# Patient Record
Sex: Female | Born: 1935 | Race: White | Hispanic: No | Smoking: Former smoker
Health system: Southern US, Community
[De-identification: ages and names within clinical notes are randomized; demographics above are authoritative.]

## PROBLEM LIST (undated history)

## (undated) DIAGNOSIS — E785 Hyperlipidemia, unspecified: Secondary | ICD-10-CM

## (undated) DIAGNOSIS — I10 Essential (primary) hypertension: Secondary | ICD-10-CM

## (undated) DIAGNOSIS — IMO0002 Reserved for concepts with insufficient information to code with codable children: Secondary | ICD-10-CM

## (undated) DIAGNOSIS — Z85038 Personal history of other malignant neoplasm of large intestine: Secondary | ICD-10-CM

## (undated) DIAGNOSIS — M1990 Unspecified osteoarthritis, unspecified site: Secondary | ICD-10-CM

## (undated) DIAGNOSIS — C801 Malignant (primary) neoplasm, unspecified: Secondary | ICD-10-CM

## (undated) DIAGNOSIS — E079 Disorder of thyroid, unspecified: Secondary | ICD-10-CM

## (undated) DIAGNOSIS — M8580 Other specified disorders of bone density and structure, unspecified site: Secondary | ICD-10-CM

## (undated) HISTORY — PX: LYMPH NODE BIOPSY: SHX201

## (undated) HISTORY — DX: Essential (primary) hypertension: I10

## (undated) HISTORY — DX: Unspecified osteoarthritis, unspecified site: M19.90

## (undated) HISTORY — DX: Hyperlipidemia, unspecified: E78.5

## (undated) HISTORY — DX: Disorder of thyroid, unspecified: E07.9

## (undated) HISTORY — DX: Reserved for concepts with insufficient information to code with codable children: IMO0002

## (undated) HISTORY — DX: Malignant (primary) neoplasm, unspecified: C80.1

## (undated) HISTORY — DX: Other specified disorders of bone density and structure, unspecified site: M85.80

## (undated) HISTORY — DX: Personal history of other malignant neoplasm of large intestine: Z85.038

---

## 2006-03-09 ENCOUNTER — Ambulatory Visit (HOSPITAL_COMMUNITY): Admission: RE | Admit: 2006-03-09 | Discharge: 2006-03-09 | Payer: Self-pay | Admitting: Family Medicine

## 2006-04-15 HISTORY — PX: OTHER SURGICAL HISTORY: SHX169

## 2006-04-21 ENCOUNTER — Ambulatory Visit (HOSPITAL_COMMUNITY): Admission: RE | Admit: 2006-04-21 | Discharge: 2006-04-21 | Payer: Self-pay | Admitting: Otolaryngology

## 2006-04-26 ENCOUNTER — Other Ambulatory Visit: Admission: RE | Admit: 2006-04-26 | Discharge: 2006-04-26 | Payer: Self-pay | Admitting: Otolaryngology

## 2006-05-11 ENCOUNTER — Other Ambulatory Visit: Admission: RE | Admit: 2006-05-11 | Discharge: 2006-05-11 | Payer: Self-pay | Admitting: Family Medicine

## 2006-05-26 ENCOUNTER — Inpatient Hospital Stay (HOSPITAL_COMMUNITY): Admission: RE | Admit: 2006-05-26 | Discharge: 2006-05-27 | Payer: Self-pay | Admitting: Otolaryngology

## 2006-06-14 ENCOUNTER — Ambulatory Visit: Payer: Self-pay | Admitting: Internal Medicine

## 2006-06-17 ENCOUNTER — Ambulatory Visit (HOSPITAL_COMMUNITY): Admission: RE | Admit: 2006-06-17 | Discharge: 2006-06-17 | Payer: Self-pay | Admitting: Internal Medicine

## 2006-06-23 ENCOUNTER — Encounter: Admission: AD | Admit: 2006-06-23 | Discharge: 2006-06-23 | Payer: Self-pay | Admitting: Dentistry

## 2006-06-23 ENCOUNTER — Ambulatory Visit: Payer: Self-pay | Admitting: Dentistry

## 2006-07-12 ENCOUNTER — Ambulatory Visit (HOSPITAL_COMMUNITY): Admission: RE | Admit: 2006-07-12 | Discharge: 2006-07-13 | Payer: Self-pay | Admitting: Otolaryngology

## 2006-07-12 ENCOUNTER — Encounter (INDEPENDENT_AMBULATORY_CARE_PROVIDER_SITE_OTHER): Payer: Self-pay | Admitting: Specialist

## 2006-07-27 ENCOUNTER — Ambulatory Visit: Admission: RE | Admit: 2006-07-27 | Discharge: 2006-10-08 | Payer: Self-pay | Admitting: Radiation Oncology

## 2007-06-30 ENCOUNTER — Ambulatory Visit: Payer: Self-pay | Admitting: *Deleted

## 2008-07-12 ENCOUNTER — Ambulatory Visit: Payer: Self-pay | Admitting: *Deleted

## 2009-01-10 ENCOUNTER — Ambulatory Visit: Payer: Self-pay | Admitting: *Deleted

## 2009-07-09 ENCOUNTER — Telehealth: Payer: Self-pay | Admitting: Internal Medicine

## 2009-12-31 ENCOUNTER — Ambulatory Visit: Payer: Self-pay | Admitting: Vascular Surgery

## 2010-07-15 NOTE — Progress Notes (Signed)
Summary: Schedule recall colon  Phone Note Outgoing Call Call back at [facility] Phone [phone]   Call placed by: Christie Nottingham CMA Duncan Dull),  July 09, 2009 1:58 PM Call placed to: Patient Summary of Call: Called pt to schedule recall colon due to her personal history of colon cancer. Pt states she just had a procedure in 2009 at [facility] and switched care. I told her that I would put a note in the sytem.  Initial call taken by: Christie Nottingham CMA (AAMA),  July 09, 2009 2:00 PM

## 2010-10-28 NOTE — Procedures (Signed)
CAROTID DUPLEX EXAM   INDICATION:  Carotid disease.   HISTORY:  Diabetes:  Yes.  Cardiac:  Arrhythmia.  Hypertension:  Yes.  Smoking:  Previous.  Previous Surgery:  No.  CV History:  Currently asymptomatic.  Amaurosis Fugax No, Paresthesias No, Hemiparesis No.                                       RIGHT                    LEFT  Brachial systolic pressure:         140                      164  Brachial Doppler waveforms:         Abnormal                 Normal  Vertebral direction of flow:        Atypical                 Antegrade  DUPLEX VELOCITIES (cm/sec)  CCA peak systolic                   P = 310/M = 94           102  ECA peak systolic                   128                      117  ICA peak systolic                   56                       134  ICA end diastolic                   14                       18  PLAQUE MORPHOLOGY:                  Calcific                 Mixed  PLAQUE AMOUNT:                      Moderate                 Moderate  PLAQUE LOCATION:                    ICA/ECA/CCA/innominate   ICA/ECA/CCA   IMPRESSION:  1. Doppler velocities suggest a 1% to 39% stenosis of the right      proximal internal carotid artery and a 40% to 59% stenosis of the      left proximal internal carotid artery; however, the percentage of      stenosis in the right internal carotid artery may be underestimated      due to calcific  plaque shadowing.  2. There is a significant plaque formation noted in the innominate      artery, resulting in an increased velocity of the right proximal      common carotid artery.  3. Asymmetric bilateral brachial pressures  noted with possible      bidirectional flow noted in the right vertebral artery, based on      limited visualization.  4. No significant change in the bilateral internal carotid arteries      noted when compared to the previous examination on 01/10/2009.       ___________________________________________  Quita Skye. Hart Rochester, M.D.   CH/MEDQ  D:  01/01/2010  T:  01/01/2010  Job:  [id number]

## 2010-10-28 NOTE — Procedures (Signed)
CAROTID DUPLEX EXAM   INDICATION:  Follow-up evaluation of known carotid artery disease.   HISTORY:  Diabetes:  Yes.  Cardiac:  No.  Hypertension:  Yes.  Smoking:  Quit in 1995.  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  CV History:  Previous carotid on March 09, 2006 revealed bilateral  calcified plaque in the carotid arteries but no significant ICA  stenosis.  At that time, a left jaw mass was discovered.  Amaurosis Fugax No, Paresthesias No, Hemiparesis No                                       RIGHT             LEFT  Brachial systolic pressure:         176               174  Brachial Doppler waveforms:         Triphasic         Triphasic  Vertebral direction of flow:        Antegrade         Antegrade  DUPLEX VELOCITIES (cm/sec)  CCA peak systolic                   60                67  ECA peak systolic                   138               94  ICA peak systolic                   92                108  ICA end diastolic                   16                18  PLAQUE MORPHOLOGY:                  Calcified, irregular                Calcified, irregular  PLAQUE AMOUNT:                      Moderate          Moderate  PLAQUE LOCATION:                    Proximal ICA, ECA Proximal ICA, ECA   IMPRESSION:  20-39% internal carotid artery stenosis bilaterally;  however, acoustic shadowing may mask higher velocities bilaterally.  No  significant change from previous study.   ___________________________________________  P. Liliane Bade, M.D.   MC/MEDQ  D:  06/30/2007  T:  06/30/2007  Job:  [id number]

## 2010-10-28 NOTE — Procedures (Signed)
CAROTID DUPLEX EXAM   INDICATION:  Followup evaluation of known carotid artery disease.   HISTORY:  Diabetes:  Yes.  Cardiac:  Arrhythmias.  Hypertension:  Yes.  Smoking:  Quit in 1995.  Previous Surgery:  The patient had a left jaw cancer removed in 04/2006.  CV History:  The patient reports no cerebrovascular symptoms at this  time.  Previous duplex on 06/30/2007 revealed 20-39% ICA stenosis  bilaterally.  Amaurosis Fugax No, Paresthesias No, Hemiparesis No                                       RIGHT             LEFT  Brachial systolic pressure:         144               150  Brachial Doppler waveforms:         Triphasic         Triphasic  Vertebral direction of flow:        Antegrade         Antegrade  DUPLEX VELOCITIES (cm/sec)  CCA peak systolic                   69                105  ECA peak systolic                   163               145  ICA peak systolic                   103               153  ICA end diastolic                   18                21  PLAQUE MORPHOLOGY:                  Mixed             Calcified  irregular  PLAQUE AMOUNT:                      Moderate          Mild to moderate  PLAQUE LOCATION:                    Proximal CCA, innominate, ICA       Proximal ICA/distal CCA   IMPRESSION:  1. 20-39% right ICA stenosis.  2. 40-59% left ICA stenosis.  3. Innominate peak systolic velocity 383 cm/second.  Motile structure      seen within the distal innominate artery.  4. Dr. Madilyn Fireman was notified of preliminary results.  The patient was set      up for a followup exam in 6 months.   ___________________________________________  P. Liliane Bade, M.D.   MC/MEDQ  D:  07/12/2008  T:  07/12/2008  Job:  [id number]

## 2010-10-28 NOTE — Assessment & Plan Note (Signed)
OFFICE VISIT   Kathy Robbins, Kathy Robbins  DOB:  03/31/1931                                       12/31/2009  CHART#:[id number]   CHIEF COMPLAINT:  Follow-up carotid studies.   HISTORY OF PRESENT ILLNESS:  Patient is a 77 year old woman who we have  been following for mild to moderate carotid stenosis, right greater than  left, secondary to a bruit heard by her primary care physician.  The  patient was last seen in November 2007 but has had vascular labs since  that time every year.  She has had no symptoms of amaurosis, speech  difficulties, swallowing difficulties, facial droop, or any other signs  of stroke.   PAST MEDICAL HISTORY:  She has some macular degeneration, some diabetes,  and some hypertension.  She denies any problems with steal symptoms in  her right upper extremity.  She has good sensation and motion in the  hand and is able to use it without difficulty.   Doppler studies of her carotids shows that her brachial systolic  pressure is 140 on the right and 164 on the left with abnormal brachial  Doppler wave forms on the right.  The CCA peak systolic, the proximal  was 310, which is elevated from her last study.  Her end-diastolic  internal carotid artery velocities were 14 on the right and 18 on the  left.  The velocities suggest 1% to 39% stenosis of the right proximal  internal carotid artery and 40% to 59% stenosis of the left proximal  internal carotid artery, but it was felt that the percentage of stenosis  in the right ICA may be underestimated due to calcified plaque  shadowing.  It was also noted that there was significant plaque  formation in the innominate artery, resulting in an increased velocities  of the right proximal common carotid artery.   On physical exam, this is a well-developed, well-nourished female  looking older than her 78 years.  Her blood pressure is 164/86 on the  left, and it was 140/80 on the right.  Sats are 96%, and  respiratory  rate is 12.  She is alert and oriented x3.  She has good equal strength  in bilateral upper and lower extremities.  She has 2+ radial pulses  bilaterally.  Both hands are warm and pink.   ASSESSMENT/PLAN:  1. Stable internal carotid artery low level stenoses.  2. Significant plaque formation in the innominate artery on the right      with increased velocities in the right proximal common carotid      artery.   Plan is to follow up in 1 year.  Information regarding signs and  symptoms of stroke and other issues of steal syndrome were discussed  with the patient.   Kathy Goo, PA-C   Quita Skye. Hart Rochester, M.D.  Electronically Signed   RR/MEDQ  D:  12/31/2009  T:  12/31/2009  Job:  [id number]

## 2010-10-28 NOTE — Procedures (Signed)
CAROTID DUPLEX EXAM   INDICATION:  Followup evaluation of known carotid artery disease.   HISTORY:  Diabetes:  Yes.  Cardiac:  Arrhythmia.  Hypertension:  Yes.  Smoking:  Quit in 1995.  Previous Surgery:  No.  CV History:  No.  Amaurosis Fugax No, Paresthesias No, Hemiparesis No                                       RIGHT             LEFT  Brachial systolic pressure:         156               170  Brachial Doppler waveforms:         WNL               WNL  Vertebral direction of flow:        Antegrade         Antegrade  DUPLEX VELOCITIES (cm/sec)  CCA peak systolic                   155               97  ECA peak systolic                   196               161  ICA peak systolic                   120               158  ICA end diastolic                   24                31  PLAQUE MORPHOLOGY:                  Calcified         Calcified  PLAQUE AMOUNT:                      Moderate          Moderate  PLAQUE LOCATION:                    CCA/ICA/ECA       CCA/ICA/ECA   IMPRESSION:  1. 20-39% right internal carotid artery stenosis.  2. 40-59% left internal carotid artery stenosis.  3. No significant change from previous study.   ___________________________________________  P. Liliane Bade, M.D.   AC/MEDQ  D:  01/10/2009  T:  01/10/2009  Job:  [id number]

## 2010-10-31 NOTE — Op Note (Signed)
Kathy Robbins, Kathy Robbins               ACCOUNT NO.:  [id number]   MEDICAL RECORD NO.:  [id number]          PATIENT TYPE:  AMB   LOCATION:  SDS                          FACILITY:  [facility]   PHYSICIAN:  Gabrielle Dare. Janee Morn, M.D.DATE OF BIRTH:  November 30, 1930   DATE OF PROCEDURE:  07/12/2006  DATE OF DISCHARGE:                               OPERATIVE REPORT   PREOPERATIVE DIAGNOSES:  1. Head and neck cancer  2. Need for enteral access and Port-A-Cath for chemotherapy.   POSTOPERATIVE DIAGNOSES:  1. Head and neck cancer  2. Need for enteral access and Port-A-Cath for chemotherapy.   OPERATION/PROCEDURE:  1. Percutaneous endoscopic gastrostomy tube placement.  2. Port-A-Cath placement via left subclavian vein.   SURGEON:  Violeta Gelinas, M.D.   ASSISTANT:  Earney Hamburg, PA-C.  He assisted on the PEG tube only.   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  She has  suspicious oral lesions.  Dr. Pollyann Kennedy is proceeding with oral biopsies  with possible partial glossectomy today.  The patient needs enteral  access and a Port-A-Cath placement.  We are proceeding with Dr. Lucky Rathke  portion to follow.   PROCEDURE IN DETAIL:  Informed consent was obtained.  The patient  received intravenous antibiotics.  She was identified in the preop  holding area.  She is brought to the operating room.  General anesthesia  was administered.  Her abdomen was prepped and draped in the sterile  fashion. Esophagogastric endoscope was inserted via her mouth into her  esophagus and down into her stomach.  No gross esophageal lesions were  noted.  Stomach was distended and then the scope was passed through the  pylorus.  First and second portions of the duodenum were unremarkable.  Scope was withdrawn back into the stomach.  Stomach was insufflated  further and excellent poke was obtained under direct visualization.  The  Angiocath was inserted into the stomach followed by the guidewire.  This  was snared with the endoscopic snare and brought out through the mouth.  The PEG tube was attached to the snare and then the guidewire was  withdrawn from the abdominal wall.  The PEG tube was brought out through  the anterior abdominal wall.  The scope was reintroduced back into the  stomach and the PEG tube position was visualized.  The flange was placed  and it was tightened up so it just turned.  The antibiotic ointment was  placed around the PEG tube site.  Stomach was decompressed and the PEG  tube connector was placed.  Scope was removed.  Pictures were taken of  the location of the PEG.  The patient tolerated this portion of the  procedure well.   Now the patient's chest and neck were prepped and draped in sterile  fashion.  Some local anesthetic was injected in the left subclavian  region.  The left subclavian vein was entered with one stick without  difficulty.  The guidewire was placed in its position into the superior  vena cava.  Was checked under fluoroscopy.  At this time some additional  local anesthetic was injected around the planned portion for the pocket  on the anterior chest wall.  Transverse incision was made in the left  upper chest.  Subcutaneous tissues were dissected out making a pocket to  fit the port.  Once this was accomplished, a small nick was made next to  the guidewire which remained in place and the tunneler was used to draw  up the Port-A-Cath catheter from the pocket next to the guidewire.  The  port was measured to length and trimmed.  It was attached to the port  and the port was inserted into the pocket.  The introducer sheath was  placed over the guidewire with the dilator.  Those were removed leaving  the introducer sheath.  The port catheter was then slid down the sheath.  The sheath was split with the catheter advanced without difficulty.  Catheter positioned.  Her  primary sheath was removed.  The  catheter was  positioned checking the tip under fluoro to be in the superior vena  cava.  The port was secured to the underlying tissues with 2-0 Prolene.  Two sutures were used.  The port was then flushed with heparinized  saline and  it flushed easily and 1 mL of concentrated straight heparin  saline was then placed.  Subcutaneous tissues in the pocket were  irrigated.  Subcutaneous tissues were approximated with running 3-0  Vicryl suture and the skin of the pocket site and the subclavian wounds  were closed with 4-0 Vicryl subcuticular stitch.  Sponge, needle and  instrument counts were all correct the patient tolerated procedure well  without apparent complication.  Remained in the operating room with Dr.  Pollyann Kennedy and the case proceed with his portion of the case.  We will check  a STAT portable chest x-ray in the recovery room afterwards.      Gabrielle Dare Janee Morn, M.D.  Electronically Signed     BET/MEDQ  D:  07/12/2006  T:  07/13/2006  Job:  [id number]   cc:   [facility] H. Pollyann Kennedy, MD

## 2010-10-31 NOTE — Op Note (Signed)
Kathy Robbins, Kathy Robbins               ACCOUNT NO.:  [id number]   MEDICAL RECORD NO.:  [id number]          PATIENT TYPE:  OIB   LOCATION:  5735                         FACILITY:  [facility]   PHYSICIAN:  Jefry H. 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   POSTOP DIAGNOSIS:  Carcinoma in situ possibly minimally invasive  carcinoma left lateral tongue.   PROCEDURE:  Is biopsy of tongue and partial glossectomy.   SURGEON:  Dr. Pollyann Kennedy   ASSISTANT:  None.   BLOOD LOSS:  40-50 mL.   FINDINGS:  A small very superficial irregular lesion involving the left  posterolateral tongue. Frozen section diagnosis consistent with  carcinoma in situ possible minimal invasion.   HISTORY:  This is a 77 year old lady who presented with a 4 cm neck mass  that was biopsied and found to contain squamous cell carcinoma.  A neck  dissection was performed and no other nodes were positive except for the  initially identified node.  Upper endoscopy and directed biopsies  revealed no suspicious lesions or positive biopsies.  On further  investigation while getting ready for postop radiation a suspicious  lesion was identified on her posterolateral tongue.  There was another  lesion identified in the floor of mouth also on the left side.  PET scan  did not reveal any activity in either of these areas.  Risks, benefits,  alternatives, complications of procedure explained to the patient and  her family who seemed to understand and agreed to surgery.   PROCEDURE:  The patient was taken to the operating room, placed the  operating table in supine position.  Following induction of general  endotracheal anesthesia the patient was initially cared for by Dr. Violeta Gelinas who  performed a percutaneous endoscopic gastrostomy tube  placement and placement of a Port-A-Cath.  Following his procedures of  which there were no complications, the patient was then handed over to  myself.  Extensive thorough survey of the oral mucosa revealed the  patient's lesion in the posterolateral tongue.  There no other lesions  identified.  The leukoplakic lesion of the floor of mouth had completely  disappeared.  A 15 scalpel and forceps was used to excise the lesion in  the posterolateral tongue.  This was sent for frozen section evaluation.  Frozen section evaluation revealed the above-mentioned findings.  A  partial glossectomy was then performed.  A 1-1.5 cm tissue margin was  taken around the entire lesion using electrocautery.  The midline was  not crossed.  The anterior tongue was left intact.  Several small  bleeders were tied using silk ligatures.  Electrocautery was used for  the dissection and for hemostasis.  The specimen was labeled long stitch  anterior, short stitch lateral, margin left side and sent for  routine pathologic evaluation.  No other palpable or visible  masses  identified.  The wound was closed using interrupted inverted 3-0 Vicryl  sutures.  The oral cavity and pharynx was irrigated and suctioned of all  blood and clots and secretions.  The patient was awakened, extubated,  transferred to recovery in stable condition.      Jefry H. Pollyann Kennedy, MD  Electronically Signed     JHR/MEDQ  D:  07/13/2006  T:  07/13/2006  Job:  [id number]

## 2011-01-15 ENCOUNTER — Other Ambulatory Visit (INDEPENDENT_AMBULATORY_CARE_PROVIDER_SITE_OTHER): Payer: Medicare Other

## 2011-01-15 DIAGNOSIS — I6529 Occlusion and stenosis of unspecified carotid artery: Secondary | ICD-10-CM

## 2011-05-11 ENCOUNTER — Encounter: Payer: Self-pay | Admitting: *Deleted

## 2011-05-12 ENCOUNTER — Encounter: Payer: Self-pay | Admitting: Cardiology

## 2011-05-12 ENCOUNTER — Ambulatory Visit (INDEPENDENT_AMBULATORY_CARE_PROVIDER_SITE_OTHER): Payer: Medicare Other | Admitting: Cardiology

## 2011-05-12 VITALS — BP 112/62 | HR 88 | Ht 63.0 in | Wt 157.0 lb

## 2011-05-12 DIAGNOSIS — E785 Hyperlipidemia, unspecified: Secondary | ICD-10-CM | POA: Insufficient documentation

## 2011-05-12 DIAGNOSIS — I4891 Unspecified atrial fibrillation: Secondary | ICD-10-CM | POA: Insufficient documentation

## 2011-05-12 DIAGNOSIS — I10 Essential (primary) hypertension: Secondary | ICD-10-CM

## 2011-05-12 DIAGNOSIS — E119 Type 2 diabetes mellitus without complications: Secondary | ICD-10-CM | POA: Insufficient documentation

## 2011-05-12 DIAGNOSIS — E039 Hypothyroidism, unspecified: Secondary | ICD-10-CM | POA: Insufficient documentation

## 2011-05-12 DIAGNOSIS — IMO0002 Reserved for concepts with insufficient information to code with codable children: Secondary | ICD-10-CM

## 2011-05-12 MED ORDER — WARFARIN SODIUM 5 MG PO TABS: 5.0000 mg | ORAL_TABLET | Freq: Every day | ORAL | Status: DC

## 2011-05-12 MED ORDER — DILTIAZEM HCL ER COATED BEADS 120 MG PO CP24: 120.0000 mg | ORAL_CAPSULE | Freq: Every day | ORAL | Status: DC

## 2011-05-12 NOTE — Patient Instructions (Signed)
Your physician has requested that you have an echocardiogram. Echocardiography is a painless test that uses sound waves to create images of your heart. It provides your doctor with information about the size and shape of your heart and how well your heart's chambers and valves are working. This procedure takes approximately one hour. There are no restrictions for this procedure.   Your physician has recommended you make the following change in your medication:  Stop Aspirin   Start Coumadin and Cardizem  You will need to have your Coumadin Level checked on Friday of this week with Dr. Kathi Der office.  Your physician recommends that you schedule a follow-up appointment in: 4 weeks with Dr. Daleen Squibb

## 2011-05-12 NOTE — Assessment & Plan Note (Signed)
She is asymptomatic but has a poorly controlled ventricular rate. In addition he has significant thromboembolic risk.  After a long discussion, performed 2-D echocardiogram to assess underlying heart disease, begin Coumadin with a protime checked on Friday with Dr. Christell Constant, and begin rate slowing therapy with low-dose diltiazem X.R 120 mg per day. I'll see her back for close followup in 3-4 weeks.  We've reviewed length the risk of bleeding, the importance of not falling, and reporting either event to medical personnel.

## 2011-05-12 NOTE — Progress Notes (Signed)
HPI  [name] is a 77 year old white female referred by Dr. Christell Constant for a newly diagnosed A. Fib. She is totally asymptomatic without palpitations, chest pain, presyncope or syncope.  His risk factors of hypertension, age, and diabetes. Her risk of thromboembolic disease is high.  She denies any bleeding history or diathesis. There's been no recent hematemesis, hemoptysis, melena or hematochezia.  EKG yesterday showed atrial fib with a rapid ventricular rate. She has nonspecific ST segment changes.  She states her balance and gait are good. She has fallen once but this was because she turned too quickly. Past Medical History  Diagnosis Date  . HTN (hypertension)   . History of colon cancer   . Atrial fibrillation or flutter   . Hyperlipidemia   . Thyroid disease     hypothyroidism  . Diabetes mellitus     NIDDM  . Osteopenia   . Arthritis     Current Outpatient Prescriptions  Medication Sig Dispense Refill  . amLODipine-benazepril (LOTREL) 10-40 MG per capsule Take 1 capsule by mouth daily.        Marland Kitchen aspirin 325 MG tablet Take 325 mg by mouth daily.        Marland Kitchen atorvastatin (LIPITOR) 40 MG tablet Take 40 mg by mouth daily.        . Calcium Carbonate-Vitamin D (CALCIUM + D PO) Take 1 tablet by mouth daily.        Marland Kitchen docusate sodium (COLACE) 50 MG capsule Take by mouth as needed.        . fish oil-omega-3 fatty acids 1000 MG capsule Take 1 g by mouth daily.        . hydrochlorothiazide (HYDRODIURIL) 25 MG tablet Take 25 mg by mouth daily.        Marland Kitchen levothyroxine (SYNTHROID, LEVOTHROID) 88 MCG tablet Take 88 mcg by mouth daily.        . metFORMIN (GLUCOPHAGE) 500 MG tablet Take 500 mg by mouth 2 (two) times daily with a meal.        . Multiple Vitamins-Minerals (CENTRUM SILVER PO) Take by mouth daily.        . Multiple Vitamins-Minerals (ICAPS PO) Take by mouth daily.        . raloxifene (EVISTA) 60 MG tablet Take 60 mg by mouth daily.          Allergies  Allergen Reactions  .  Iohexol      Code: HIVES, Desc: AFTER CT PT HAD HIVES,SOME SOB TREATED BY MD [facility] NEEDS PRE.MEDS.NEXT TIME, Onset Date: [id number]     Family History  Problem Relation Age of Onset  . Cancer Mother     "female organs"  . Stroke Mother 8    History   Social History  . Marital Status: Divorced    Spouse Name: N/A    Number of Children: N/A  . Years of Education: N/A   Occupational History  . Not on file.   Social History Main Topics  . Smoking status: Former Games developer  . Smokeless tobacco: Not on file   Comment: quit in 1995  . Alcohol Use: No  . Drug Use: No  . Sexually Active: Not on file   Other Topics Concern  . Not on file   Social History Narrative  . No narrative on file    ROS ALL NEGATIVE EXCEPT THOSE NOTED IN HPI  PE  General Appearance: well developed, well nourished in no acute distress HEENT: symmetrical face, PERRLA, good dentition  Neck: no  JVD, thyromegaly, or adenopathy, trachea midline Chest: symmetric without deformity Cardiac: PMI non-displaced, Irregular rate and rhythm, normal S1, S2, no gallop or murmur Lung: clear to ausculation and percussion Vascular: all pulses full without bruits  Abdominal: nondistended, nontender, good bowel sounds, no HSM, no bruits Extremities: no cyanosis, clubbing or edema, no sign of DVT, no varicosities  Skin: normal color, no rashes Neuro: alert and oriented x 3, non-focal Pysch: normal affect  EKG  BMET No results found for this basename: na, k, cl, co2, glucose, bun, creatinine, calcium, gfrnonaa, gfraa    Lipid Panel  No results found for this basename: chol, trig, hdl, cholhdl, vldl, ldlcalc    CBC No results found for this basename: wbc, rbc, hgb, hct, plt, mcv, mch, mchc, rdw, neutrabs, lymphsabs, monoabs, eosabs, basosabs

## 2011-05-13 ENCOUNTER — Ambulatory Visit (HOSPITAL_COMMUNITY): Payer: Medicare Other | Attending: Cardiology | Admitting: Radiology

## 2011-05-13 DIAGNOSIS — IMO0002 Reserved for concepts with insufficient information to code with codable children: Secondary | ICD-10-CM

## 2011-05-13 DIAGNOSIS — I379 Nonrheumatic pulmonary valve disorder, unspecified: Secondary | ICD-10-CM | POA: Insufficient documentation

## 2011-05-13 DIAGNOSIS — I079 Rheumatic tricuspid valve disease, unspecified: Secondary | ICD-10-CM | POA: Insufficient documentation

## 2011-05-13 DIAGNOSIS — I10 Essential (primary) hypertension: Secondary | ICD-10-CM | POA: Insufficient documentation

## 2011-05-13 DIAGNOSIS — I080 Rheumatic disorders of both mitral and aortic valves: Secondary | ICD-10-CM | POA: Insufficient documentation

## 2011-05-13 DIAGNOSIS — I4891 Unspecified atrial fibrillation: Secondary | ICD-10-CM

## 2011-05-13 DIAGNOSIS — E785 Hyperlipidemia, unspecified: Secondary | ICD-10-CM | POA: Insufficient documentation

## 2011-05-13 DIAGNOSIS — E119 Type 2 diabetes mellitus without complications: Secondary | ICD-10-CM | POA: Insufficient documentation

## 2011-05-15 LAB — PROTIME-INR

## 2011-06-12 ENCOUNTER — Encounter: Payer: Self-pay | Admitting: Cardiology

## 2011-06-16 HISTORY — PX: COLONOSCOPY: SHX174

## 2011-06-17 ENCOUNTER — Ambulatory Visit (INDEPENDENT_AMBULATORY_CARE_PROVIDER_SITE_OTHER): Payer: Medicare Other | Admitting: Cardiology

## 2011-06-17 ENCOUNTER — Encounter: Payer: Self-pay | Admitting: *Deleted

## 2011-06-17 ENCOUNTER — Encounter: Payer: Self-pay | Admitting: Cardiology

## 2011-06-17 VITALS — BP 142/80 | HR 117 | Ht 63.0 in | Wt 153.0 lb

## 2011-06-17 DIAGNOSIS — E039 Hypothyroidism, unspecified: Secondary | ICD-10-CM

## 2011-06-17 DIAGNOSIS — I10 Essential (primary) hypertension: Secondary | ICD-10-CM

## 2011-06-17 DIAGNOSIS — I4891 Unspecified atrial fibrillation: Secondary | ICD-10-CM

## 2011-06-17 MED ORDER — DILTIAZEM HCL ER COATED BEADS 240 MG PO CP24: 240.0000 mg | ORAL_CAPSULE | Freq: Every day | ORAL | Status: DC

## 2011-06-17 MED ORDER — BENAZEPRIL HCL 40 MG PO TABS: 40.0000 mg | ORAL_TABLET | Freq: Every day | ORAL | Status: DC

## 2011-06-17 NOTE — Progress Notes (Signed)
HPI Kathy Robbins comes in today for evaluation and management of her chronic A. Fib, anticoagulation, and hypertension.  He is a very nervous lady. Her blood pressures up a bit today and her heart rates running about 120. She denies any palpitations, chest pain, orthopnea, PND, or edema. She is compliant with medications.  Review of her meds shows her to be on 2 calcium channel blockers. She is on a low dose of Cardizem.  She is not having any problems with Coumadin. She has had some rectal bleeding however and is scheduled for colonoscopy later this month. We have cleared her. I have sent a note.  Past Medical History  Diagnosis Date  . HTN (hypertension)   . History of colon cancer   . Atrial fibrillation or flutter   . Hyperlipidemia   . Thyroid disease     hypothyroidism  . Diabetes mellitus     NIDDM  . Osteopenia   . Arthritis     Current Outpatient Prescriptions  Medication Sig Dispense Refill  . atorvastatin (LIPITOR) 40 MG tablet Take 40 mg by mouth daily.        . Calcium Carbonate-Vitamin D (CALCIUM + D PO) Take 1 tablet by mouth daily.        Marland Kitchen diltiazem (CARDIZEM CD) 240 MG 24 hr capsule Take 1 capsule (240 mg total) by mouth daily.  30 capsule  11  . docusate sodium (COLACE) 50 MG capsule Take by mouth as needed.        . fish oil-omega-3 fatty acids 1000 MG capsule Take 1 g by mouth daily.        . hydrochlorothiazide (HYDRODIURIL) 25 MG tablet Take 25 mg by mouth daily.        Marland Kitchen levothyroxine (SYNTHROID, LEVOTHROID) 100 MCG tablet Take 100 mcg by mouth daily.        . metFORMIN (GLUCOPHAGE) 500 MG tablet Take 500 mg by mouth 2 (two) times daily with a meal.        . Multiple Vitamins-Minerals (CENTRUM SILVER PO) Take by mouth daily.        . Multiple Vitamins-Minerals (ICAPS PO) Take by mouth daily.        . raloxifene (EVISTA) 60 MG tablet Take 60 mg by mouth daily.        Marland Kitchen warfarin (COUMADIN) 5 MG tablet Take 1 tablet (5 mg total) by mouth daily.  30 tablet  4    . benazepril (LOTENSIN) 40 MG tablet Take 1 tablet (40 mg total) by mouth daily.  30 tablet  11    Allergies  Allergen Reactions  . Iohexol      Code: HIVES, Desc: AFTER CT PT HAD HIVES,SOME SOB TREATED BY MD [facility] NEEDS PRE.MEDS.NEXT TIME, Onset Date: 16109604     Family History  Problem Relation Age of Onset  . Cancer Mother     "female organs"  . Stroke Mother 71    History   Social History  . Marital Status: Divorced    Spouse Name: N/A    Number of Children: N/A  . Years of Education: N/A   Occupational History  . Not on file.   Social History Main Topics  . Smoking status: Former Games developer  . Smokeless tobacco: Not on file   Comment: quit in 1995  . Alcohol Use: No  . Drug Use: No  . Sexually Active: Not on file   Other Topics Concern  . Not on file   Social History Narrative  .  No narrative on file    ROS ALL NEGATIVE EXCEPT THOSE NOTED IN HPI  PE  General Appearance: well developed, well nourished in no acute distress HEENT: symmetrical face, PERRLA, good dentition  Neck: no JVD, thyromegaly, or adenopathy, trachea midline Chest: symmetric without deformity Cardiac: PMI non-displaced,rapid irregular rate and rhythm normal S1, S2, no gallop or murmur Lung: clear to ausculation and percussion Vascular: all pulses full without bruits  Abdominal: nondistended, nontender, good bowel sounds, no HSM, no bruits Extremities: no cyanosis, clubbing or edema, no sign of DVT, no varicosities  Skin: normal color, no rashes Neuro: alert and oriented x 3, non-focal Pysch: normal affect  EKG Chronic A. Fib, suboptimal ventricular rate control. BMET No results found for this basename: na, k, cl, co2, glucose, bun, creatinine, calcium, gfrnonaa, gfraa    Lipid Panel  No results found for this basename: chol, trig, hdl, cholhdl, vldl, ldlcalc    CBC No results found for this basename: wbc, rbc, hgb, hct, plt, mcv, mch, mchc, rdw, neutrabs, lymphsabs,  monoabs, eosabs, basosabs

## 2011-06-17 NOTE — Patient Instructions (Signed)
Your physician recommends that you schedule a follow-up appointment with Dr. Daleen Squibb in 4-6 weeks.

## 2011-06-17 NOTE — Assessment & Plan Note (Signed)
Suboptimal control. Adjustments made. Will see back in 4 weeks.

## 2011-06-17 NOTE — Assessment & Plan Note (Signed)
Her rate is not well controlled. I will increase her diltiazem extended release 240 mg a day. I discontinued her amlodipine. I will schedule her for followup in about 4 weeks.

## 2011-07-24 ENCOUNTER — Other Ambulatory Visit: Payer: Self-pay | Admitting: Family Medicine

## 2011-07-24 DIAGNOSIS — Z1231 Encounter for screening mammogram for malignant neoplasm of breast: Secondary | ICD-10-CM

## 2011-07-30 ENCOUNTER — Ambulatory Visit (INDEPENDENT_AMBULATORY_CARE_PROVIDER_SITE_OTHER): Payer: Medicare Other | Admitting: Cardiology

## 2011-07-30 ENCOUNTER — Encounter: Payer: Self-pay | Admitting: Cardiology

## 2011-07-30 VITALS — BP 185/94 | HR 97 | Ht 63.0 in | Wt 153.0 lb

## 2011-07-30 DIAGNOSIS — I10 Essential (primary) hypertension: Secondary | ICD-10-CM

## 2011-07-30 DIAGNOSIS — IMO0002 Reserved for concepts with insufficient information to code with codable children: Secondary | ICD-10-CM

## 2011-07-30 DIAGNOSIS — I4891 Unspecified atrial fibrillation: Secondary | ICD-10-CM

## 2011-07-30 MED ORDER — DILTIAZEM HCL ER COATED BEADS 300 MG PO CP24: 300.0000 mg | ORAL_CAPSULE | Freq: Every day | ORAL | Status: DC

## 2011-07-30 NOTE — Assessment & Plan Note (Signed)
Poor control. We'll increase her diltiazem extended release to 300 mg a day. Goal being systolic less than 150 with her  unsteady gait and age. We'll schedule nursing visit in a couple of weeks

## 2011-07-30 NOTE — Patient Instructions (Signed)
Your physician has recommended you make the following change in your medication: Diltiazem 300mg  daily - pick up at pharmacy and begin taking tomorrow.  Your physician recommends that you schedule a follow-up Nurse Visit appointment in 2 weeks for blood pressure check.  Your physician wants you to follow-up in: 6 months with Dr. Daleen Squibb. You will receive a reminder letter in the mail two months in advance. If you don't receive a letter, please call our office to schedule the follow-up appointment.

## 2011-07-30 NOTE — Progress Notes (Signed)
HPI Kathy Robbins returns for close followup or her chronic A. Fib, increased ventricular rate, and hypertension. On last visit, we stopped her amlodipine and began her on diltiazem extended release 240 mg a day.  Her blood pressure is still elevated today. I confirmed at 180 systolic. Heart rate has improved and is in the 90s. She denies any presyncope or syncope. He sometimes.hears her heart beating in her left ear  Past Medical History  Diagnosis Date  . HTN (hypertension)   . History of colon cancer   . Atrial fibrillation or flutter   . Hyperlipidemia   . Thyroid disease     hypothyroidism  . Diabetes mellitus     NIDDM  . Osteopenia   . Arthritis     Current Outpatient Prescriptions  Medication Sig Dispense Refill  . atorvastatin (LIPITOR) 40 MG tablet Take 40 mg by mouth daily.        . benazepril (LOTENSIN) 40 MG tablet Take 1 tablet (40 mg total) by mouth daily.  30 tablet  11  . calcium carbonate (OS-CAL) 600 MG TABS Take 600 mg by mouth daily.      Marland Kitchen diltiazem (CARDIZEM CD) 300 MG 24 hr capsule Take 1 capsule (300 mg total) by mouth daily.  30 capsule  11  . docusate sodium (COLACE) 50 MG capsule Take by mouth as needed.        . fish oil-omega-3 fatty acids 1000 MG capsule Take 1 g by mouth daily.        . hydrochlorothiazide (HYDRODIURIL) 25 MG tablet Take 25 mg by mouth daily.        Marland Kitchen levothyroxine (SYNTHROID, LEVOTHROID) 100 MCG tablet Take 100 mcg by mouth daily.        . metFORMIN (GLUCOPHAGE) 500 MG tablet Take 500 mg by mouth 2 (two) times daily with a meal.        . Multiple Vitamins-Minerals (CENTRUM SILVER PO) Take by mouth daily.        . Multiple Vitamins-Minerals (ICAPS PO) Take by mouth daily.        . raloxifene (EVISTA) 60 MG tablet Take 60 mg by mouth daily.        Marland Kitchen warfarin (COUMADIN) 5 MG tablet Take 1 tablet (5 mg total) by mouth daily.  30 tablet  4    Allergies  Allergen Reactions  . Iohexol      Code: HIVES, Desc: AFTER CT PT HAD HIVES,SOME  SOB TREATED BY MD [facility] NEEDS PRE.MEDS.NEXT TIME, Onset Date: 16109604     Family History  Problem Relation Age of Onset  . Cancer Mother     "female organs"  . Stroke Mother 74    History   Social History  . Marital Status: Divorced    Spouse Name: N/A    Number of Children: N/A  . Years of Education: N/A   Occupational History  . Not on file.   Social History Main Topics  . Smoking status: Former Games developer  . Smokeless tobacco: Not on file   Comment: quit in 1995  . Alcohol Use: No  . Drug Use: No  . Sexually Active: Not on file   Other Topics Concern  . Not on file   Social History Narrative  . No narrative on file    ROS ALL NEGATIVE EXCEPT THOSE NOTED IN HPI  PE  General Appearance: well developed, well nourished in no acute distress HEENT: symmetrical face, PERRLA, good dentition  Neck: no JVD, thyromegaly, or  adenopathy, trachea midline Chest: symmetric without deformity Cardiac: PMI non-displaced, Irregular rate and rhythm  normal S1, S2, no gallop or murmur Lung: clear to ausculation and percussion Vascular: all pulses full without bruits  Abdominal: nondistended, nontender, good bowel sounds, no HSM, no bruits Extremities: no cyanosis, clubbing or edema, no sign of DVT, no varicosities  Skin: normal color, no rashes Neuro: alert and oriented x 3, non-focal Pysch: Anxious, baseline EKG Not repeated BMET No results found for this basename: na, k, cl, co2, glucose, bun, creatinine, calcium, gfrnonaa, gfraa    Lipid Panel  No results found for this basename: chol, trig, hdl, cholhdl, vldl, ldlcalc    CBC No results found for this basename: wbc, rbc, hgb, hct, plt, mcv, mch, mchc, rdw, neutrabs, lymphsabs, monoabs, eosabs, basosabs

## 2011-07-30 NOTE — Assessment & Plan Note (Signed)
Heart rate has improved some from last visit. Would like a resting heart rate less than 80. We increased her diltiazem extended release to 300 mg a day.

## 2011-08-13 ENCOUNTER — Ambulatory Visit (INDEPENDENT_AMBULATORY_CARE_PROVIDER_SITE_OTHER): Payer: Medicare Other

## 2011-08-13 ENCOUNTER — Other Ambulatory Visit: Payer: Self-pay | Admitting: Cardiology

## 2011-08-13 ENCOUNTER — Telehealth: Payer: Self-pay | Admitting: Cardiology

## 2011-08-13 VITALS — BP 180/90 | HR 94 | Wt 157.0 lb

## 2011-08-13 DIAGNOSIS — I119 Hypertensive heart disease without heart failure: Secondary | ICD-10-CM

## 2011-08-13 MED ORDER — METOPROLOL TARTRATE 25 MG PO TABS: 25.0000 mg | ORAL_TABLET | Freq: Two times a day (BID) | ORAL | Status: DC

## 2011-08-13 NOTE — Progress Notes (Signed)
Discussed with Lawson Fiscal NP and will add Lopressor 25 mg twice daily. Asked patient if she had ever taken Lopressor and she stated she did not think so.  Per patient, she does have a follow up appointment with her PCP tomorrow.  Did advised for her to get a blood pressure monitor and take to her visit tomorrow to compare for accuracy.  Did have her schedule up with Scott PA in 2 weeks for evaluation.

## 2011-08-13 NOTE — Telephone Encounter (Signed)
New msg Metoprolol rx has not been received by [facility] pharmacy please resend

## 2011-08-13 NOTE — Telephone Encounter (Signed)
Advised patient Rx should be at pharmacy now

## 2011-08-13 NOTE — Telephone Encounter (Signed)
Pt was in earlier and was told a rx would be called in, not sure the name but went to pharmacy and rx not there

## 2011-08-28 ENCOUNTER — Ambulatory Visit (INDEPENDENT_AMBULATORY_CARE_PROVIDER_SITE_OTHER): Payer: Medicare Other | Admitting: Physician Assistant

## 2011-08-28 ENCOUNTER — Encounter: Payer: Self-pay | Admitting: Physician Assistant

## 2011-08-28 VITALS — BP 138/80 | HR 78 | Ht 63.0 in | Wt 158.0 lb

## 2011-08-28 DIAGNOSIS — I10 Essential (primary) hypertension: Secondary | ICD-10-CM

## 2011-08-28 DIAGNOSIS — I4891 Unspecified atrial fibrillation: Secondary | ICD-10-CM

## 2011-08-28 NOTE — Patient Instructions (Signed)
Your physician recommends that you schedule a follow-up appointment in: WITH DR. WALL IN July  NO CHANGES TODAY

## 2011-08-28 NOTE — Progress Notes (Signed)
[address] [place], [place]  [address] Phone: [phone] Fax:  [phone]  Date:  08/28/2011   Name:  Kathy Robbins       DOB:  1930/12/30 MRN:  [id number]  PCP:  Sheron Nightingale, NP Primary Cardiologist:  Dr. Valera Castle  Primary Electrophysiologist:  None    History of Present Illness: Kathy Robbins is a 78 y.o. female who presents for follow up.  She has a history of hypertension and atrial fibrillation as well as hyperlipidemia, hypothyroidism and diabetes.  Echocardiogram 05/05/11: Mild LVH with moderate concentric hypertrophy, EF 60-65%, trace AI, mild MR, moderate to severe LAD, moderate TR, PASP 43.  Last seen by Dr. Daleen Squibb 07/30/11.  He adjusted her diltiazem for blood pressure control as well as to better control her heart rate.  Follow up nurse visit 08/05/11 she had a blood pressure 180/90 with a pulse of 94.  She was placed on Lopressor and scheduled for follow up today.  Doing well today.  BP looks much better.  The patient denies chest pain, shortness of breath, syncope, orthopnea, PND or significant pedal edema.  No bleeding problems.    Past Medical History  Diagnosis Date  . HTN (hypertension)   . History of colon cancer   . Atrial fibrillation or flutter   . Hyperlipidemia   . Thyroid disease     hypothyroidism  . Diabetes mellitus     NIDDM  . Osteopenia   . Arthritis     Current Outpatient Prescriptions  Medication Sig Dispense Refill  . atorvastatin (LIPITOR) 40 MG tablet Take 40 mg by mouth daily.        . benazepril (LOTENSIN) 40 MG tablet Take 1 tablet (40 mg total) by mouth daily.  30 tablet  11  . calcium carbonate (OS-CAL) 600 MG TABS Take 600 mg by mouth daily.      Marland Kitchen diltiazem (CARDIZEM CD) 300 MG 24 hr capsule Take 1 capsule (300 mg total) by mouth daily.  30 capsule  11  . docusate sodium (COLACE) 50 MG capsule Take by mouth as needed.        . fish oil-omega-3 fatty acids 1000 MG capsule Take 1 g by mouth daily.        .  hydrochlorothiazide (HYDRODIURIL) 25 MG tablet Take 25 mg by mouth daily.        Marland Kitchen levothyroxine (SYNTHROID, LEVOTHROID) 100 MCG tablet Take 100 mcg by mouth daily.        . metFORMIN (GLUCOPHAGE) 500 MG tablet Take 500 mg by mouth 2 (two) times daily with a meal.        . metoprolol tartrate (LOPRESSOR) 25 MG tablet Take 1 tablet (25 mg total) by mouth 2 (two) times daily.  60 tablet  11  . Multiple Vitamins-Minerals (CENTRUM SILVER PO) Take by mouth daily.        . Multiple Vitamins-Minerals (ICAPS PO) Take by mouth daily.        . raloxifene (EVISTA) 60 MG tablet Take 60 mg by mouth daily.        Marland Kitchen warfarin (COUMADIN) 5 MG tablet Take 1 tablet (5 mg total) by mouth daily.  30 tablet  4    Allergies: Allergies  Allergen Reactions  . Iohexol      Code: HIVES, Desc: AFTER CT PT HAD HIVES,SOME SOB TREATED BY MD [facility] NEEDS PRE.MEDS.NEXT TIME, Onset Date: [id number]     History  Substance Use Topics  . Smoking status:  Former Smoker  . Smokeless tobacco: Not on file   Comment: quit in 1995  . Alcohol Use: No     PHYSICAL EXAM: VS:  BP 138/80  Pulse 78  Ht 5\' 3"  (1.6 m)  Wt 158 lb (71.668 kg)  BMI 27.99 kg/m2 Well nourished, well developed, in no acute distress HEENT: normal Neck: no JVD Cardiac:  normal S1, S2; irreg irreg rhythm; no murmur Lungs:  clear to auscultation bilaterally, no wheezing, rhonchi or rales Abd: soft, nontender, no hepatomegaly Ext: no edema Skin: warm and dry Neuro:  CNs 2-12 intact, no focal abnormalities noted  EKG:  Afib, HR 73  ASSESSMENT AND PLAN:  1. Atrial fibrillation   Rate controlled.  Coumadin managed by PCP.  Follow up with Dr. Valera Castle as scheduled.   2. Hypertension  Controlled.  Continue current therapy.      SignedTereso Newcomer, PA-C  10:06 AM 08/28/2011

## 2011-10-01 ENCOUNTER — Other Ambulatory Visit: Payer: Self-pay | Admitting: Cardiology

## 2011-11-12 ENCOUNTER — Ambulatory Visit
Admission: RE | Admit: 2011-11-12 | Discharge: 2011-11-12 | Disposition: A | Discharge status: Home / Self Care | Payer: Medicare Other | Source: Ambulatory Visit | Attending: Family Medicine | Admitting: Family Medicine

## 2011-11-12 DIAGNOSIS — Z1231 Encounter for screening mammogram for malignant neoplasm of breast: Secondary | ICD-10-CM

## 2011-12-14 DIAGNOSIS — C801 Malignant (primary) neoplasm, unspecified: Secondary | ICD-10-CM

## 2011-12-14 HISTORY — DX: Malignant (primary) neoplasm, unspecified: C80.1

## 2011-12-21 ENCOUNTER — Telehealth: Payer: Self-pay | Admitting: Vascular Surgery

## 2011-12-21 NOTE — Telephone Encounter (Signed)
Message copied by Fredrich Birks on Mon Dec 21, 2011  2:42 PM ------      Message from: Marcellus Scott      Created: Mon Dec 21, 2011  2:03 PM      Contact: [phone]       Mariyah just called to schedule an appointment.             Thanks      Revonda Standard

## 2011-12-21 NOTE — Telephone Encounter (Signed)
Spoke with patient, she needed her annual follow up. Pulled chart, last carotid duplex 01/15/2011. Scheduled carotid and see NP on 01/15/2012 @ 11:00am, pt agreed, sent letter also, dpm

## 2012-01-08 ENCOUNTER — Other Ambulatory Visit: Payer: Self-pay | Admitting: *Deleted

## 2012-01-08 DIAGNOSIS — I6529 Occlusion and stenosis of unspecified carotid artery: Secondary | ICD-10-CM

## 2012-01-14 ENCOUNTER — Encounter: Payer: Self-pay | Admitting: Neurosurgery

## 2012-01-15 ENCOUNTER — Ambulatory Visit (INDEPENDENT_AMBULATORY_CARE_PROVIDER_SITE_OTHER): Payer: Medicare Other | Admitting: Neurosurgery

## 2012-01-15 ENCOUNTER — Encounter: Payer: Self-pay | Admitting: Neurosurgery

## 2012-01-15 ENCOUNTER — Other Ambulatory Visit (INDEPENDENT_AMBULATORY_CARE_PROVIDER_SITE_OTHER): Payer: Medicare Other | Admitting: *Deleted

## 2012-01-15 VITALS — BP 140/62 | HR 56 | Resp 16 | Ht 63.0 in | Wt 155.0 lb

## 2012-01-15 DIAGNOSIS — I6529 Occlusion and stenosis of unspecified carotid artery: Secondary | ICD-10-CM

## 2012-01-15 NOTE — Addendum Note (Signed)
Addended by: Sharee Pimple on: 01/15/2012 12:40 PM   Modules accepted: Orders

## 2012-01-15 NOTE — Progress Notes (Signed)
[facility] Carotid Office Note  CC: Annual carotid duplex Referring Physician: Fields  History of Present Illness: 78 year old female patient of Dr. Darrick Penna followed for known carotid stenosis. The patient denies any signs or symptoms of CVA, TIA, amaurosis fugax or any neural deficit. The patient denies any new medical diagnoses or recent surgery.  Past Medical History  Diagnosis Date  . HTN (hypertension)   . History of colon cancer   . Atrial fibrillation or flutter   . Hyperlipidemia   . Thyroid disease     hypothyroidism  . Diabetes mellitus     NIDDM  . Osteopenia   . Arthritis   . Cancer July 2013    Throat    ROS: [x]  Positive   [ ]  Denies    General: [ ]  Weight loss, [ ]  Fever, [ ]  chills Neurologic: [ ]  Dizziness, [ ]  Blackouts, [ ]  Seizure [ ]  Stroke, [ ]  "Mini stroke", [ ]  Slurred speech, [ ]  Temporary blindness; [ ]  weakness in arms or legs, [ ]  Hoarseness Cardiac: [ ]  Chest pain/pressure, [ ]  Shortness of breath at rest [ ]  Shortness of breath with exertion, [ ]  Atrial fibrillation or irregular heartbeat Vascular: [ ]  Pain in legs with walking, [ ]  Pain in legs at rest, [ ]  Pain in legs at night,  [ ]  Non-healing ulcer, [ ]  Blood clot in vein/DVT,   Pulmonary: [ ]  Home oxygen, [ ]  Productive cough, [ ]  Coughing up blood, [ ]  Asthma,  [ ]  Wheezing Musculoskeletal:  [ ]  Arthritis, [ ]  Low back pain, [ ]  Joint pain Hematologic: [ ]  Easy Bruising, [ ]  Anemia; [ ]  Hepatitis Gastrointestinal: [ ]  Blood in stool, [ ]  Gastroesophageal Reflux/heartburn, [ ]  Trouble swallowing Urinary: [ ]  chronic Kidney disease, [ ]  on HD - [ ]  MWF or [ ]  TTHS, [ ]  Burning with urination, [ ]  Difficulty urinating Skin: [ ]  Rashes, [ ]  Wounds Psychological: [ ]  Anxiety, [ ]  Depression   Social History History  Substance Use Topics  . Smoking status: Former Games developer  . Smokeless tobacco: Not on file   Comment: quit in 1995  . Alcohol Use: No    Family  History Family History  Problem Relation Age of Onset  . Cancer Mother     "female organs"  . Stroke Mother 71    Allergies  Allergen Reactions  . Iohexol      Code: HIVES, Desc: AFTER CT PT HAD HIVES,SOME SOB TREATED BY MD [facility] NEEDS PRE.MEDS.NEXT TIME, Onset Date: [id number]     Current Outpatient Prescriptions  Medication Sig Dispense Refill  . atorvastatin (LIPITOR) 40 MG tablet Take 40 mg by mouth daily.        . benazepril (LOTENSIN) 40 MG tablet Take 1 tablet (40 mg total) by mouth daily.  30 tablet  11  . calcium carbonate (OS-CAL) 600 MG TABS Take 600 mg by mouth daily.      Marland Kitchen diltiazem (CARDIZEM CD) 300 MG 24 hr capsule Take 1 capsule (300 mg total) by mouth daily.  30 capsule  11  . docusate sodium (COLACE) 50 MG capsule Take by mouth as needed.        . fish oil-omega-3 fatty acids 1000 MG capsule Take 1 g by mouth daily.        . hydrochlorothiazide (HYDRODIURIL) 25 MG tablet Take 25 mg by mouth daily.        Marland Kitchen levothyroxine (SYNTHROID, LEVOTHROID)  100 MCG tablet Take 50 mcg by mouth daily.       . metFORMIN (GLUCOPHAGE) 500 MG tablet Take 500 mg by mouth 2 (two) times daily with a meal.        . metoprolol tartrate (LOPRESSOR) 25 MG tablet Take 1 tablet (25 mg total) by mouth 2 (two) times daily.  60 tablet  11  . Multiple Vitamins-Minerals (CENTRUM SILVER PO) Take by mouth daily.        . Multiple Vitamins-Minerals (ICAPS PO) Take by mouth daily.        . raloxifene (EVISTA) 60 MG tablet Take 60 mg by mouth daily.        [name] 20 MG TABS Take 20 mg by mouth daily.      Marland Kitchen warfarin (COUMADIN) 5 MG tablet Take 1 tablet (5 mg total) by mouth daily.  30 tablet  4    Physical Examination  Filed Vitals:   01/15/12 1210  BP: 140/62  Pulse: 56  Resp:     Body mass index is 27.46 kg/(m^2).  General:  WDWN in NAD Gait: Normal HEENT: WNL Eyes: Pupils equal Pulmonary: normal non-labored breathing , without Rales, rhonchi,  wheezing Cardiac: RRR, without   Murmurs, rubs or gallops; Abdomen: soft, NT, no masses Skin: no rashes, ulcers noted  Vascular Exam Pulses: 3+ radial pulses bilaterally Carotid bruits: Carotid pulses to auscultation no bruits are heard Extremities without ischemic changes, no Gangrene , no cellulitis; no open wounds;  Musculoskeletal: no muscle wasting or atrophy   Neurologic: A&O X 3; Appropriate Affect ; SENSATION: normal; MOTOR FUNCTION:  moving all extremities equally. Speech is fluent/normal  Non-Invasive Vascular Imaging CAROTID DUPLEX 01/15/2012  Right ICA 40 - 59 % stenosis Left ICA 20 - 39 % stenosis   ASSESSMENT/PLAN: Asymptomatic patient with mild to moderate carotid stenosis bilaterally that is unchanged from previous exam one year ago. The patient's questions were encouraged and answered, she knows the signs and symptoms of CVA and knows to report to the nearest emergency department should that occur. The patient will followup here in one year with repeat carotid duplex.  Lauree Chandler ANP  Clinic M.D.: Imogene Burn

## 2012-01-28 NOTE — Procedures (Unsigned)
CAROTID DUPLEX EXAM  INDICATION:  Carotid disease  HISTORY: Diabetes:  Yes Cardiac:  Arrhythmia Hypertension:  Yes Smoking:  Previous Previous Surgery:  No CV History:  Currently asymptomatic Amaurosis Fugax No, Paresthesias No, Hemiparesis No                                      RIGHT              LEFT Brachial systolic pressure:         98                 138 Brachial Doppler waveforms:         Abnormal           Normal Vertebral direction of flow:        Retrograde         Antegrade DUPLEX VELOCITIES (cm/sec) CCA peak systolic                   7 (bidirectional/mostly retrograde)  69 ECA peak systolic                   45 (retrograde)    51 ICA peak systolic                   52                 120 ICA end diastolic                   6                  20 PLAQUE MORPHOLOGY:                  Mixed/calcific     Mixed PLAQUE AMOUNT:                      Moderate/severe    Moderate PLAQUE LOCATION:                    ICA/ECA/CCA        ICA/ECA/CCA  IMPRESSION: 1. Bidirectional flow noted in the right common carotid artery with     significant plaque formations noted in the proximal to mid     segments.  The right external carotid artery flow appears to be     retrograde based on limited visualization due to calcific plaque     formations.  Unable to adequately visualize the right common     carotid and subclavian artery origins due to calcific shadowing.     Dampened waveforms noted in the right carotid artery system with     velocities of the right internal carotid artery not demonstrating a     hemodynamically significant stenosis.  Retrograde flow is noted in     the right vertebral artery with monophasic right subclavian artery     waveforms suggestive of known right subclavian steal syndrome. 2. Doppler velocities of the left proximal internal carotid artery     suggesting 1%-39% stenosis. 3. No significant change noted when compared to the  previous exam on     01/15/2011.  ___________________________________________ Janetta Hora. Fields, MD  CH/MEDQ  D:  01/15/2012  T:  01/15/2012  Job:  [id number]

## 2012-02-08 ENCOUNTER — Encounter: Payer: Self-pay | Admitting: Cardiology

## 2012-02-08 ENCOUNTER — Ambulatory Visit (INDEPENDENT_AMBULATORY_CARE_PROVIDER_SITE_OTHER): Payer: Medicare Other | Admitting: Cardiology

## 2012-02-08 VITALS — BP 128/76 | HR 78 | Ht 63.0 in | Wt 154.0 lb

## 2012-02-08 DIAGNOSIS — I4891 Unspecified atrial fibrillation: Secondary | ICD-10-CM

## 2012-02-08 DIAGNOSIS — I6529 Occlusion and stenosis of unspecified carotid artery: Secondary | ICD-10-CM

## 2012-02-08 DIAGNOSIS — I10 Essential (primary) hypertension: Secondary | ICD-10-CM

## 2012-02-08 DIAGNOSIS — E785 Hyperlipidemia, unspecified: Secondary | ICD-10-CM

## 2012-02-08 NOTE — Progress Notes (Signed)
HPI Kathy Robbins comes in today for evaluation and management of her history of chronic A. fib, anticoagulation, and history of hypertension, hyperlipidemia as well as carotid artery disease. The latter is followed by the vascular surgery team and is stable by recent carotid Doppler per patient.  She is having no palpitations and no syncope or presyncope. She denies significant shortness of breath. She is fairly limited in ambulation because of severe macular degeneration. She walks with a cane.  She denies orthopnea, PND or edema.  Past Medical History  Diagnosis Date  . HTN (hypertension)   . History of colon cancer   . Atrial fibrillation or flutter   . Hyperlipidemia   . Thyroid disease     hypothyroidism  . Diabetes mellitus     NIDDM  . Osteopenia   . Arthritis   . Cancer July 2013    Throat    Current Outpatient Prescriptions  Medication Sig Dispense Refill  . atorvastatin (LIPITOR) 40 MG tablet Take 40 mg by mouth daily.        . benazepril (LOTENSIN) 40 MG tablet Take 1 tablet (40 mg total) by mouth daily.  30 tablet  11  . calcium carbonate (OS-CAL) 600 MG TABS Take 600 mg by mouth daily.      Kathy Robbins diltiazem (CARDIZEM CD) 300 MG 24 hr capsule Take 1 capsule (300 mg total) by mouth daily.  30 capsule  11  . docusate sodium (COLACE) 50 MG capsule Take by mouth as needed.        . fish oil-omega-3 fatty acids 1000 MG capsule Take 1 g by mouth daily.        . hydrochlorothiazide (HYDRODIURIL) 25 MG tablet Take 25 mg by mouth daily.        Kathy Robbins levothyroxine (SYNTHROID, LEVOTHROID) 100 MCG tablet Take 50 mcg by mouth daily.       . metFORMIN (GLUCOPHAGE) 500 MG tablet Take 500 mg by mouth 2 (two) times daily with a meal.        . metoprolol tartrate (LOPRESSOR) 25 MG tablet Take 1 tablet (25 mg total) by mouth 2 (two) times daily.  60 tablet  11  . Multiple Vitamins-Minerals (CENTRUM SILVER PO) Take by mouth daily.        . Multiple Vitamins-Minerals (ICAPS PO) Take by mouth daily.         . raloxifene (EVISTA) 60 MG tablet Take 60 mg by mouth daily.        Kathy Robbins 20 MG TABS Take 20 mg by mouth daily.        Allergies  Allergen Reactions  . Iohexol      Code: HIVES, Desc: AFTER CT PT HAD HIVES,SOME SOB TREATED BY MD [facility] NEEDS PRE.MEDS.NEXT TIME, Onset Date: [id number]     Family History  Problem Relation Age of Onset  . Cancer Mother     "female organs"  . Stroke Mother 47    History   Social History  . Marital Status: Divorced    Spouse Name: N/A    Number of Children: N/A  . Years of Education: N/A   Occupational History  . Not on file.   Social History Main Topics  . Smoking status: Former Games developer  . Smokeless tobacco: Not on file   Comment: quit in 1995  . Alcohol Use: No  . Drug Use: No  . Sexually Active: Not on file   Other Topics Concern  . Not on file   Social History  Narrative  . No narrative on file    ROS ALL NEGATIVE EXCEPT THOSE NOTED IN HPI  PE  General Appearance: well developed, well nourished in no acute distress HEENT: symmetrical face, PERRLA, good dentition  Neck: no JVD, thyromegaly, or adenopathy, trachea midline, status post left node and muscle resection Chest: symmetric without deformity Cardiac: PMI non-displaced, irregular rate and rhythm, normal S1, S2, no gallop or murmur Lung: clear to ausculation and percussion Vascular: all pulses full without bruits  Abdominal: nondistended, nontender, good bowel sounds, no HSM, no bruits Extremities: no cyanosis, clubbing or edema, no sign of DVT, no varicosities  Skin: normal color, no rashes Neuro: alert and oriented x 3, non-focal Pysch: normal affect  EKG Chronic A. fib, no acute changes BMET No results found for this basename: na, k, cl, co2, glucose, bun, creatinine, calcium, gfrnonaa, gfraa    Lipid Panel  No results found for this basename: chol, trig, hdl, cholhdl, vldl, ldlcalc    CBC No results found for this basename: wbc, rbc, hgb,  hct, plt, mcv, mch, mchc, rdw, neutrabs, lymphsabs, monoabs, eosabs, basosabs

## 2012-02-08 NOTE — Patient Instructions (Addendum)
Your physician recommends that you continue on your current medications as directed. Please refer to the Current Medication list given to you today.  Your physician wants you to follow-up in: 1 year. You will receive a reminder letter in the mail two months in advance. If you don't receive a letter, please call our office to schedule the follow-up appointment.  

## 2012-02-08 NOTE — Assessment & Plan Note (Signed)
Stable. Continue with rate control and anticoagulation.

## 2012-05-24 ENCOUNTER — Other Ambulatory Visit: Payer: Self-pay | Admitting: *Deleted

## 2012-05-24 MED ORDER — BENAZEPRIL HCL 40 MG PO TABS: 40.0000 mg | ORAL_TABLET | Freq: Every day | ORAL | Status: DC

## 2012-06-17 ENCOUNTER — Other Ambulatory Visit: Payer: Self-pay | Admitting: Cardiology

## 2012-06-17 MED ORDER — DILTIAZEM HCL ER COATED BEADS 300 MG PO CP24: 300.0000 mg | ORAL_CAPSULE | Freq: Every day | ORAL | Status: DC

## 2012-08-31 ENCOUNTER — Telehealth: Payer: Self-pay | Admitting: Family Medicine

## 2012-08-31 NOTE — Telephone Encounter (Signed)
PT HAS QUESTION ON HOW TO TAKE LIPITOR.

## 2012-08-31 NOTE — Telephone Encounter (Signed)
Pt wanted to take samples of Lipitor before filling the generic Rx. Explained to pt that she could take samples if dosage was the same. Pt stated she would just go ahead and start the new Rx.

## 2012-09-06 ENCOUNTER — Other Ambulatory Visit: Payer: Self-pay

## 2012-09-06 MED ORDER — RALOXIFENE HCL 60 MG PO TABS: 60.0000 mg | ORAL_TABLET | Freq: Every day | ORAL | Status: DC

## 2012-09-12 ENCOUNTER — Encounter: Payer: Self-pay | Admitting: *Deleted

## 2012-10-13 ENCOUNTER — Ambulatory Visit: Payer: Self-pay | Admitting: Family Medicine

## 2012-10-14 ENCOUNTER — Other Ambulatory Visit: Payer: Self-pay | Admitting: Family Medicine

## 2012-10-17 ENCOUNTER — Encounter: Payer: Self-pay | Admitting: Family Medicine

## 2012-10-17 ENCOUNTER — Ambulatory Visit (INDEPENDENT_AMBULATORY_CARE_PROVIDER_SITE_OTHER): Payer: Medicare HMO | Admitting: Family Medicine

## 2012-10-17 VITALS — BP 149/68 | HR 58 | Temp 97.3°F | Ht 63.0 in | Wt 149.4 lb

## 2012-10-17 DIAGNOSIS — I10 Essential (primary) hypertension: Secondary | ICD-10-CM

## 2012-10-17 DIAGNOSIS — I6529 Occlusion and stenosis of unspecified carotid artery: Secondary | ICD-10-CM

## 2012-10-17 DIAGNOSIS — E119 Type 2 diabetes mellitus without complications: Secondary | ICD-10-CM

## 2012-10-17 DIAGNOSIS — E785 Hyperlipidemia, unspecified: Secondary | ICD-10-CM

## 2012-10-17 DIAGNOSIS — I4891 Unspecified atrial fibrillation: Secondary | ICD-10-CM

## 2012-10-17 DIAGNOSIS — E039 Hypothyroidism, unspecified: Secondary | ICD-10-CM

## 2012-10-17 NOTE — Progress Notes (Signed)
Subjective:     Patient ID: Kathy Robbins, female   DOB: [birth year], 79 y.o.   MRN: [id number]  HPI Patient is here for follow up of hypertension:Medication was adjusted.  denies Headache;deniesChest Pain;denies weakness;denies Shortness of Breath or Orthopnea;denies Visual changes;denies palpitations;denies cough;denies pedal edema;denies symptoms of TIA or stroke; admits to Compliance with medications. denies Problems with medications.  . Past Medical History  Diagnosis Date  . HTN (hypertension)   . History of colon cancer   . Atrial fibrillation or flutter   . Hyperlipidemia   . Thyroid disease     hypothyroidism  . Diabetes mellitus     NIDDM  . Osteopenia   . Arthritis   . Cancer July 2013    Throat   Past Surgical History  Procedure Laterality Date  . 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 Lymph node biopsy      removal  . Colonoscopy  Jan. 2013    6 polyps  removed   Current Outpatient Prescriptions on File Prior to Visit  Medication Sig Dispense Refill  . atorvastatin (LIPITOR) 40 MG tablet Take 40 mg by mouth daily.        . benazepril (LOTENSIN) 40 MG tablet Take 1 tablet (40 mg total) by mouth daily.  30 tablet  11  . calcium carbonate (OS-CAL) 600 MG TABS Take 600 mg by mouth daily.      Marland Kitchen diltiazem (CARDIZEM CD) 300 MG 24 hr capsule Take 1 capsule (300 mg total) by mouth daily.  30 capsule  11  . docusate sodium (COLACE) 50 MG capsule Take by mouth as needed.        . fish oil-omega-3 fatty acids 1000 MG capsule Take 1 g by mouth daily.        . hydrochlorothiazide (HYDRODIURIL) 25 MG tablet Take 25 mg by mouth daily.        Marland Kitchen levothyroxine (SYNTHROID, LEVOTHROID) 100 MCG tablet Take 50 mcg by mouth daily.       . metFORMIN (GLUCOPHAGE) 500 MG tablet Take 500 mg by mouth 2 (two) times daily with a meal.        . metoprolol (LOPRESSOR) 50 MG tablet TAKE  (1)  TABLET TWICE A DAY.  60 tablet  3  . Multiple Vitamins-Minerals  (CENTRUM SILVER PO) Take by mouth daily.        . Multiple Vitamins-Minerals (ICAPS PO) Take by mouth daily.        . raloxifene (EVISTA) 60 MG tablet Take 1 tablet (60 mg total) by mouth daily.  30 tablet  11  . XARELTO 20 MG TABS Take 20 mg by mouth daily.      . metoprolol tartrate (LOPRESSOR) 25 MG tablet Take 1 tablet (25 mg total) by mouth 2 (two) times daily.  60 tablet  11   No current facility-administered medications on file prior to visit.   Allergies  Allergen Reactions  . Iohexol      Code: HIVES, Desc: AFTER CT PT HAD HIVES,SOME SOB TREATED BY MD [facility] NEEDS PRE.MEDS.NEXT TIME, Onset Date: [id number]    Immunization History  Administered Date(s) Administered  . Pneumococcal Polysaccharide 06/15/2005  . Tdap 06/15/2005   History   Social History  . Marital Status: Divorced    Spouse Name: N/A    Number of Children: N/A  . Years of Education: N/A   Occupational History  . Not on file.   Social History  Main Topics  . Smoking status: Former Games developer  . Smokeless tobacco: Not on file     Comment: quit in 1995  . Alcohol Use: No  . Drug Use: No  . Sexually Active: Not on file   Other Topics Concern  . Not on file   Social History Narrative  . No narrative on file   Review of Systems  All other systems reviewed and are negative.       Objective:    Physical Exam  Constitutional: She is oriented to person, place, and time. She appears well-developed and well-nourished.  HENT:  Head: Normocephalic and atraumatic.  Eyes: Conjunctivae are normal. Pupils are equal, round, and reactive to light.  Neck: Normal range of motion. Neck supple.  Cardiovascular: Regular rhythm.  Exam reveals no gallop and no friction rub.   No murmur heard. Pulmonary/Chest: Effort normal and breath sounds normal. No respiratory distress. She has no wheezes. She has no rales. She exhibits no tenderness.  Abdominal: Soft. Bowel sounds are normal.  Musculoskeletal: Normal range  of motion.  Neurological: She is alert and oriented to person, place, and time. No cranial nerve deficit.  Skin: Skin is warm and dry. No rash noted. No erythema. No pallor.  Psychiatric: She has a normal mood and affect.   BP 149/68  Pulse 58  Temp(Src) 97.3 F (36.3 C) (Oral)  Ht 5\' 3"  (1.6 m)  Wt 149 lb 6.4 oz (67.767 kg)  BMI 26.47 kg/m2     Assessment:    Type 2 diabetes mellitus  Hypertension  Hyperlipidemia  Hypothyroidism  Atrial fibrillation   Occlusion and stenosis of carotid artery without mention of cerebral infarction, unspecified laterality  Labs reviewed and stable and at goal for age. Will accept BP at present levels due to age and present HR.     Plan:     Medications  Reviewed. Continue present medication regimen. Low salt diet. Keep active. RTC in  2months.  Tida Saner P. [place] Charon, M.D.

## 2012-11-15 ENCOUNTER — Other Ambulatory Visit: Payer: Self-pay

## 2012-11-15 MED ORDER — RIVAROXABAN 20 MG PO TABS: 20.0000 mg | ORAL_TABLET | Freq: Every day | ORAL | Status: DC

## 2012-12-07 ENCOUNTER — Other Ambulatory Visit: Payer: Self-pay | Admitting: *Deleted

## 2012-12-07 ENCOUNTER — Other Ambulatory Visit: Payer: Self-pay | Admitting: Family Medicine

## 2012-12-07 MED ORDER — LEVOTHYROXINE SODIUM 100 MCG PO TABS: 50.0000 ug | ORAL_TABLET | Freq: Every day | ORAL | Status: DC

## 2012-12-07 MED ORDER — METFORMIN HCL 500 MG PO TABS: 500.0000 mg | ORAL_TABLET | Freq: Two times a day (BID) | ORAL | Status: DC

## 2012-12-23 ENCOUNTER — Ambulatory Visit: Payer: Medicare HMO | Admitting: Family Medicine

## 2013-01-19 ENCOUNTER — Ambulatory Visit: Payer: Medicare Other | Admitting: Neurosurgery

## 2013-01-19 ENCOUNTER — Other Ambulatory Visit (INDEPENDENT_AMBULATORY_CARE_PROVIDER_SITE_OTHER): Payer: Medicare PPO | Admitting: *Deleted

## 2013-01-19 DIAGNOSIS — I6529 Occlusion and stenosis of unspecified carotid artery: Secondary | ICD-10-CM

## 2013-01-23 ENCOUNTER — Other Ambulatory Visit: Payer: Self-pay | Admitting: *Deleted

## 2013-01-24 ENCOUNTER — Encounter: Payer: Self-pay | Admitting: General Practice

## 2013-01-24 ENCOUNTER — Ambulatory Visit (INDEPENDENT_AMBULATORY_CARE_PROVIDER_SITE_OTHER): Payer: Medicare HMO | Admitting: General Practice

## 2013-01-24 ENCOUNTER — Telehealth: Payer: Self-pay | Admitting: Family Medicine

## 2013-01-24 VITALS — BP 170/76 | HR 66 | Temp 97.0°F | Ht 63.0 in | Wt 146.0 lb

## 2013-01-24 DIAGNOSIS — N926 Irregular menstruation, unspecified: Secondary | ICD-10-CM

## 2013-01-24 DIAGNOSIS — N390 Urinary tract infection, site not specified: Secondary | ICD-10-CM

## 2013-01-24 LAB — POCT URINALYSIS DIPSTICK
Ketones, UA: NEGATIVE
Spec Grav, UA: 1.015
Urobilinogen, UA: NEGATIVE
pH, UA: 6.5

## 2013-01-24 LAB — POCT UA - MICROSCOPIC ONLY

## 2013-01-24 MED ORDER — CIPROFLOXACIN HCL 500 MG PO TABS: 500.0000 mg | ORAL_TABLET | Freq: Two times a day (BID) | ORAL | Status: DC

## 2013-01-24 NOTE — Addendum Note (Signed)
Addended by: Philomena Doheny E on: 01/24/2013 04:48 PM   Modules accepted: Orders

## 2013-01-24 NOTE — Addendum Note (Signed)
Addended by: Orma Render F on: 01/24/2013 03:40 PM   Modules accepted: Orders

## 2013-01-24 NOTE — Telephone Encounter (Signed)
APPT MADE

## 2013-01-24 NOTE — Patient Instructions (Addendum)
Urinary Tract Infection  Urinary tract infections (UTIs) can develop anywhere along your urinary tract. Your urinary tract is your body's drainage system for removing wastes and extra water. Your urinary tract includes two kidneys, two ureters, a bladder, and a urethra. Your kidneys are a pair of bean-shaped organs. Each kidney is about the size of your fist. They are located below your ribs, one on each side of your spine.  CAUSES  Infections are caused by microbes, which are microscopic organisms, including fungi, viruses, and bacteria. These organisms are so small that they can only be seen through a microscope. Bacteria are the microbes that most commonly cause UTIs.  SYMPTOMS   Symptoms of UTIs may vary by age and gender of the patient and by the location of the infection. Symptoms in young women typically include a frequent and intense urge to urinate and a painful, burning feeling in the bladder or urethra during urination. Older women and men are more likely to be tired, shaky, and weak and have muscle aches and abdominal pain. A fever may mean the infection is in your kidneys. Other symptoms of a kidney infection include pain in your back or sides below the ribs, nausea, and vomiting.  DIAGNOSIS  To diagnose a UTI, your caregiver will ask you about your symptoms. Your caregiver also will ask to provide a urine sample. The urine sample will be tested for bacteria and white blood cells. White blood cells are made by your body to help fight infection.  TREATMENT   Typically, UTIs can be treated with medication. Because most UTIs are caused by a bacterial infection, they usually can be treated with the use of antibiotics. The choice of antibiotic and length of treatment depend on your symptoms and the type of bacteria causing your infection.  HOME CARE INSTRUCTIONS   If you were prescribed antibiotics, take them exactly as your caregiver instructs you. Finish the medication even if you feel better after you  have only taken some of the medication.   Drink enough water and fluids to keep your urine clear or pale yellow.   Avoid caffeine, tea, and carbonated beverages. They tend to irritate your bladder.   Empty your bladder often. Avoid holding urine for long periods of time.   Empty your bladder before and after sexual intercourse.   After a bowel movement, women should cleanse from front to back. Use each tissue only once.  SEEK MEDICAL CARE IF:    You have back pain.   You develop a fever.   Your symptoms do not begin to resolve within 3 days.  SEEK IMMEDIATE MEDICAL CARE IF:    You have severe back pain or lower abdominal pain.   You develop chills.   You have nausea or vomiting.   You have continued burning or discomfort with urination.  MAKE SURE YOU:    Understand these instructions.   Will watch your condition.   Will get help right away if you are not doing well or get worse.  Document Released: 03/11/2005 Document Revised: 12/01/2011 Document Reviewed: 07/10/2011  [employer] Patient Information 2014 [employer]

## 2013-01-24 NOTE — Progress Notes (Addendum)
  Subjective:    Patient ID: Kathy Robbins, female    DOB: February 01, 1931, 79 y.o.   MRN: [id number]  HPI Patient present with complaints of seeing small amount of blood on toilet tissue after wiping and having a bowel movement. Reports this occurred yesterday and no blood noticed since then. She reports having a history of hemorrhoids. She reports a family history of cancer (mother). She reports having a colonoscopy two years ago in [place] (Dr. Teena Dunk), polyps removed. Reports he wants repeated in 4 years. Patient is unsure if blood on tissue came from rectal or vaginal area. Reports burning sensation upon urination. She denies hysterectomy.      Review of Systems  Constitutional: Negative for fever and chills.  Respiratory: Negative for chest tightness and shortness of breath.   Cardiovascular: Negative for chest pain and palpitations.  Gastrointestinal: Negative for abdominal pain.  Neurological: Negative for dizziness, weakness and headaches.       Objective:   Physical Exam  Constitutional: She appears well-developed and well-nourished.  Cardiovascular: Normal rate, regular rhythm and normal heart sounds.   Pulmonary/Chest: Effort normal and breath sounds normal. No respiratory distress. She exhibits no tenderness.  Abdominal: Soft. Bowel sounds are normal. She exhibits no distension. There is no tenderness.  Neurological: She is alert.  Skin: Skin is warm and dry.  Psychiatric: She has a normal mood and affect.      Results for orders placed in visit on 01/24/13  POCT URINALYSIS DIPSTICK      Result Value Range   Color, UA gold     Clarity, UA clear     Glucose, UA negative     Bilirubin, UA negative     Ketones, UA negative     Spec Grav, UA 1.015     Blood, UA large     pH, UA 6.5     Protein, UA 4+     Urobilinogen, UA negative     Nitrite, UA negative     Leukocytes, UA moderate (2+)    POCT UA - MICROSCOPIC ONLY      Result Value Range   WBC, Ur, HPF, POC 1-5     RBC, urine, microscopic 30-40     Bacteria, U Microscopic few     Mucus, UA moderate     Epithelial cells, urine per micros few     Crystals, Ur, HPF, POC negative     Casts, Ur, LPF, POC negative     Yeast, UA negative      Assessment & Plan:  1. Irregular bleeding - POCT urinalysis dipstick -FOB kit provided to patient and discussed -patient to schedule pap -RTO or seek emergency treatment if symptoms return -Patient verbalized understanding -Coralie Keens, FNP-C  2. UTI (urinary tract infection) - ciprofloxacin (CIPRO) 500 MG tablet; Take 1 tablet (500 mg total) by mouth 2 (two) times daily.  Dispense: 20 tablet; Refill: 0 -take medication as directed AZO over the counter X2 days Frequent voiding Proper perineal hygiene RTO prn, recheck urine in 2 weeks and for scheduled pap -Patient verbalized understanding -Coralie Keens, FNP-C

## 2013-01-30 ENCOUNTER — Other Ambulatory Visit: Payer: Self-pay | Admitting: Family Medicine

## 2013-01-31 ENCOUNTER — Encounter: Payer: Self-pay | Admitting: Vascular Surgery

## 2013-02-03 ENCOUNTER — Encounter: Payer: Self-pay | Admitting: General Practice

## 2013-02-03 ENCOUNTER — Ambulatory Visit (INDEPENDENT_AMBULATORY_CARE_PROVIDER_SITE_OTHER): Payer: Medicare HMO | Admitting: General Practice

## 2013-02-03 VITALS — BP 149/62 | HR 72 | Temp 95.8°F | Ht 63.0 in | Wt 145.0 lb

## 2013-02-03 DIAGNOSIS — Z0000 Encounter for general adult medical examination without abnormal findings: Secondary | ICD-10-CM

## 2013-02-03 DIAGNOSIS — Z124 Encounter for screening for malignant neoplasm of cervix: Secondary | ICD-10-CM

## 2013-02-03 LAB — POCT UA - MICROSCOPIC ONLY: WBC, Ur, HPF, POC: 2.5

## 2013-02-03 LAB — POCT URINALYSIS DIPSTICK
Glucose, UA: NEGATIVE
Ketones, UA: NEGATIVE
Leukocytes, UA: NEGATIVE
Spec Grav, UA: 1.025

## 2013-02-03 NOTE — Progress Notes (Signed)
  Subjective:    Patient ID: Kathy Robbins, female    DOB: 04/16/1931, 79 y.o.   MRN: [id number]  HPI Patient presents today for annual pap. She denies any concerns or questions. She has a history of hyperlipidemia, hypertension, afib, hypothyroidism, and diabetes. She is taking medications as prescribed.     Review of Systems  Constitutional: Negative for fever and chills.  HENT: Negative for neck pain and neck stiffness.   Respiratory: Negative for chest tightness and shortness of breath.   Cardiovascular: Negative for chest pain and palpitations.  Gastrointestinal: Negative for vomiting, abdominal pain, diarrhea and blood in stool.  Musculoskeletal: Negative for back pain.  Neurological: Negative for dizziness, weakness and headaches.       Objective:   Physical Exam  Constitutional: She is oriented to person, place, and time. She appears well-developed and well-nourished.  HENT:  Head: Normocephalic and atraumatic.  Right Ear: External ear normal.  Left Ear: External ear normal.  Mouth/Throat: Oropharynx is clear and moist.  Eyes: Conjunctivae and EOM are normal. Pupils are equal, round, and reactive to light.  Neck: Normal range of motion. Neck supple. No thyromegaly present.  Cardiovascular: Normal rate, regular rhythm, normal heart sounds and intact distal pulses.   Pulmonary/Chest: Effort normal and breath sounds normal. No respiratory distress.  Patient refused breast exam  Abdominal: Soft. Bowel sounds are normal. She exhibits no distension.  Genitourinary: Vagina normal and uterus normal. Guaiac negative stool. No breast swelling, tenderness, discharge or bleeding. No labial fusion. There is no rash, tenderness, lesion or injury on the right labia. There is no rash, tenderness, lesion or injury on the left labia. Uterus is not deviated, not enlarged, not fixed and not tender. Cervix exhibits no motion tenderness, no discharge and no friability. Right adnexum displays no  mass, no tenderness and no fullness. Left adnexum displays no mass, no tenderness and no fullness. No erythema, tenderness or bleeding around the vagina. No foreign body around the vagina. No signs of injury around the vagina. No vaginal discharge found.  Lymphadenopathy:    She has no cervical adenopathy.  Neurological: She is alert and oriented to person, place, and time.  Skin: Skin is warm and dry.  Psychiatric: She has a normal mood and affect.          Assessment & Plan:  1. Annual physical exam - POCT urinalysis dipstick - POCT UA - Microscopic Only - Pap IG w/ reflex to HPV when ASC-U -RTO if symptoms develop and keep scheduled appointments -Patient verbalized understanding -Coralie Keens, FNP-C

## 2013-02-03 NOTE — Patient Instructions (Signed)

## 2013-02-03 NOTE — Addendum Note (Signed)
Addended by: Lisbeth Ply C on: 02/03/2013 11:27 AM   Modules accepted: Orders

## 2013-02-07 LAB — PAP IG W/ RFLX HPV ASCU: PAP Smear Comment: 0

## 2013-02-10 ENCOUNTER — Other Ambulatory Visit (INDEPENDENT_AMBULATORY_CARE_PROVIDER_SITE_OTHER): Payer: Medicare HMO

## 2013-02-10 DIAGNOSIS — Z1212 Encounter for screening for malignant neoplasm of rectum: Secondary | ICD-10-CM

## 2013-02-21 ENCOUNTER — Telehealth: Payer: Self-pay | Admitting: General Practice

## 2013-02-21 NOTE — Telephone Encounter (Signed)
Patient notified that hem cards negative

## 2013-02-24 ENCOUNTER — Ambulatory Visit (INDEPENDENT_AMBULATORY_CARE_PROVIDER_SITE_OTHER): Payer: Medicare HMO | Admitting: General Practice

## 2013-02-24 ENCOUNTER — Encounter: Payer: Self-pay | Admitting: General Practice

## 2013-02-24 ENCOUNTER — Ambulatory Visit: Payer: Medicare HMO | Admitting: Family Medicine

## 2013-02-24 VITALS — BP 185/86 | HR 84 | Temp 97.5°F | Ht 63.0 in | Wt 143.5 lb

## 2013-02-24 DIAGNOSIS — Z09 Encounter for follow-up examination after completed treatment for conditions other than malignant neoplasm: Secondary | ICD-10-CM

## 2013-02-24 DIAGNOSIS — E119 Type 2 diabetes mellitus without complications: Secondary | ICD-10-CM

## 2013-02-24 DIAGNOSIS — E039 Hypothyroidism, unspecified: Secondary | ICD-10-CM

## 2013-02-24 DIAGNOSIS — E785 Hyperlipidemia, unspecified: Secondary | ICD-10-CM

## 2013-02-24 DIAGNOSIS — I10 Essential (primary) hypertension: Secondary | ICD-10-CM

## 2013-02-24 LAB — POCT CBC
Granulocyte percent: 75.4 %G (ref 37–80)
Hemoglobin: 13.3 g/dL (ref 12.2–16.2)
Lymph, poc: 1.3 (ref 0.6–3.4)
MCH, POC: 29.3 pg (ref 27–31.2)
MPV: 7 fL (ref 0–99.8)
POC LYMPH PERCENT: 18.7 %L (ref 10–50)
Platelet Count, POC: 237 10*3/uL (ref 142–424)
RBC: 4.6 M/uL (ref 4.04–5.48)

## 2013-02-24 LAB — POCT GLYCOSYLATED HEMOGLOBIN (HGB A1C): Hemoglobin A1C: 6.2

## 2013-02-24 MED ORDER — ATORVASTATIN CALCIUM 10 MG PO TABS: 10.0000 mg | ORAL_TABLET | Freq: Every day | ORAL | Status: DC

## 2013-02-24 MED ORDER — RIVAROXABAN 20 MG PO TABS: 20.0000 mg | ORAL_TABLET | Freq: Every day | ORAL | Status: DC

## 2013-02-24 MED ORDER — METFORMIN HCL 500 MG PO TABS: 500.0000 mg | ORAL_TABLET | Freq: Two times a day (BID) | ORAL | Status: DC

## 2013-02-24 NOTE — Progress Notes (Signed)
  Subjective:    Patient ID: Kathy Robbins, female    DOB: 07/21/1930, 79 y.o.   MRN: [id number]  HPI Patient presents today for 3 month follow up of chronic health conditions. She has a history of hypertension, hyperlipidemia, hypothyroidism, atrial fib, and diabetes. She reports taking medication as directed. She reports checking blood sugars twice daily and range 90's-100. Reports eating healthy and walking for exercise. Denies any complaints at this time.     Review of Systems  Constitutional: Negative for fever and chills.  HENT: Negative for neck pain and neck stiffness.   Eyes: Negative for pain.       Cataract surgery both eyes  Respiratory: Negative for chest tightness and shortness of breath.   Cardiovascular: Negative for chest pain and palpitations.  Gastrointestinal: Negative for nausea, vomiting, abdominal pain, constipation and blood in stool.  Genitourinary: Negative for dysuria, hematuria and difficulty urinating.  Musculoskeletal: Negative for back pain.  Neurological: Negative for dizziness, weakness and headaches.       Objective:   Physical Exam  Constitutional: She is oriented to person, place, and time. She appears well-developed and well-nourished.  HENT:  Head: Normocephalic and atraumatic.  Right Ear: External ear normal.  Left Ear: External ear normal.  Nose: Nose normal.  Mouth/Throat: Oropharynx is clear and moist.  Eyes: EOM are normal.  Cataract surgery both eyes  Neck: Normal range of motion. Neck supple. No thyromegaly present.  Cardiovascular: Normal rate, regular rhythm and normal heart sounds.   Pulmonary/Chest: Effort normal and breath sounds normal. No respiratory distress. She exhibits no tenderness.  Abdominal: Soft. Bowel sounds are normal. She exhibits no distension. There is no tenderness.  Musculoskeletal: She exhibits no edema and no tenderness.  Lymphadenopathy:    She has no cervical adenopathy.  Neurological: She is alert and  oriented to person, place, and time.  Skin: Skin is warm and dry.  Psychiatric: She has a normal mood and affect.          Assessment & Plan:  1. Unspecified hypothyroidism - Thyroid Panel With TSH  2. Other and unspecified hyperlipidemia - NMR, lipoprofile  3. Diabetes - POCT glycosylated hemoglobin (Hb A1C)  4. Hypertension - CMP14+EGFR  5. Follow-up exam, 3-6 months since previous exam - POCT CBC -Continue all current medications Labs pending F/u in 3 months Discussed exercise and diet  Patient verbalized understanding Coralie Keens, FNP-C

## 2013-02-26 LAB — CMP14+EGFR
ALT: 6 IU/L (ref 0–32)
Albumin: 4.3 g/dL (ref 3.5–4.8)
BUN: 15 mg/dL (ref 8–27)
CO2: 29 mmol/L (ref 18–29)
Chloride: 94 mmol/L — ABNORMAL LOW (ref 97–108)
Glucose: 105 mg/dL — ABNORMAL HIGH (ref 65–99)
Potassium: 4.2 mmol/L (ref 3.5–5.2)
Total Protein: 6.6 g/dL (ref 6.0–8.5)

## 2013-02-26 LAB — NMR, LIPOPROFILE
Cholesterol: 116 mg/dL (ref <200)
LDL Particle Number: 586 nmol/L (ref <1000)
LDLC SERPL CALC-MCNC: 43 mg/dL (ref <100)
LP-IR Score: 36 (ref <=45)
Triglycerides by NMR: 130 mg/dL (ref <150)

## 2013-02-26 LAB — THYROID PANEL WITH TSH
Free Thyroxine Index: 2.8 (ref 1.2–4.9)
T3 Uptake Ratio: 29 % (ref 24–39)
T4, Total: 9.7 ug/dL (ref 4.5–12.0)

## 2013-03-27 ENCOUNTER — Other Ambulatory Visit: Payer: Self-pay | Admitting: Family Medicine

## 2013-05-20 ENCOUNTER — Other Ambulatory Visit: Payer: Self-pay | Admitting: Cardiology

## 2013-05-30 ENCOUNTER — Encounter: Payer: Self-pay | Admitting: General Practice

## 2013-05-30 ENCOUNTER — Ambulatory Visit (INDEPENDENT_AMBULATORY_CARE_PROVIDER_SITE_OTHER): Payer: Medicare HMO | Admitting: General Practice

## 2013-05-30 VITALS — BP 190/76 | HR 76 | Temp 95.6°F | Ht 63.0 in | Wt 139.5 lb

## 2013-05-30 DIAGNOSIS — E119 Type 2 diabetes mellitus without complications: Secondary | ICD-10-CM

## 2013-05-30 DIAGNOSIS — M810 Age-related osteoporosis without current pathological fracture: Secondary | ICD-10-CM

## 2013-05-30 DIAGNOSIS — I4891 Unspecified atrial fibrillation: Secondary | ICD-10-CM

## 2013-05-30 DIAGNOSIS — I10 Essential (primary) hypertension: Secondary | ICD-10-CM

## 2013-05-30 DIAGNOSIS — E039 Hypothyroidism, unspecified: Secondary | ICD-10-CM

## 2013-05-30 DIAGNOSIS — E785 Hyperlipidemia, unspecified: Secondary | ICD-10-CM

## 2013-05-30 NOTE — Patient Instructions (Signed)

## 2013-05-30 NOTE — Progress Notes (Signed)
Subjective:    Patient ID: Kathy Robbins, female    DOB: [birth year], 79 y.o.   MRN: [id number]  HPI Patient presents today for chronic health follow up. She has a history of hypothyroidism, Hyperlipidemia, DM type 2, hypertension, and atrial fibrillation. Reports checking blood sugars daily and ranges in the 90's. Reports eating a healthy diet. She reports taking medications as directed. Patient's son reports she seems slightly forgetful and has some question of dementia. Patient verbalized being some distracted and upset, due to receiving a new TV and having difficulty using the remote control. Patient verbalized that she enjoys watching television and it is important that she is able to see her favorite shows.   Review of Systems  Constitutional: Negative for fever and chills.  Eyes: Positive for visual disturbance. Negative for photophobia, pain and redness.       Vision disturbance from macular degeneration  Respiratory: Negative for chest tightness and shortness of breath.   Cardiovascular: Negative for chest pain and palpitations.  Gastrointestinal: Negative for nausea, vomiting, abdominal pain, constipation and blood in stool.  Genitourinary: Negative for dysuria, hematuria and difficulty urinating.  Musculoskeletal: Negative for back pain, neck pain and neck stiffness.  Neurological: Negative for dizziness, weakness and headaches.       Objective:   Physical Exam  Constitutional: She is oriented to person, place, and time. She appears well-developed and well-nourished.  HENT:  Head: Normocephalic and atraumatic.  Right Ear: External ear normal.  Left Ear: External ear normal.  Nose: Nose normal.  Mouth/Throat: Oropharynx is clear and moist.  Eyes: EOM are normal.  Cataract surgery both eyes and vision disturbance from macular degeneration  Neck: Normal range of motion. Neck supple. No thyromegaly present.  Cardiovascular: Normal rate, regular rhythm and normal heart sounds.    Pulmonary/Chest: Effort normal and breath sounds normal. No respiratory distress. She exhibits no tenderness.  Abdominal: Soft. Bowel sounds are normal. She exhibits no distension. There is no tenderness.  Musculoskeletal: She exhibits no edema and no tenderness.  Lymphadenopathy:    She has no cervical adenopathy.  Neurological: She is alert and oriented to person, place, and time.  Skin: Skin is warm and dry.  Psychiatric: She has a normal mood and affect.  MMSE performed        Assessment & Plan:  MMSE score 27  1. Diabetes mellitus, type 2  - POCT glycosylated hemoglobin (Hb A1C) - metFORMIN (GLUCOPHAGE) 500 MG tablet; Take 1 tablet (500 mg total) by mouth 2 (two) times daily with a meal.  Dispense: 60 tablet; Refill: 4  2. Hyperlipidemia  - NMR, lipoprofile - atorvastatin (LIPITOR) 10 MG tablet; Take 1 tablet (10 mg total) by mouth daily.  Dispense: 30 tablet; Refill: 4  3. Hypertension  - CMP14+EGFR - metoprolol (LOPRESSOR) 50 MG tablet; Take 1 tablet (50 mg total) by mouth 2 (two) times daily.  Dispense: 60 tablet; Refill: 4 - hydrochlorothiazide (HYDRODIURIL) 25 MG tablet; Take 1 tablet (25 mg total) by mouth daily.  Dispense: 30 tablet; Refill: 4  4. Osteoporosis, unspecified  - raloxifene (EVISTA) 60 MG tablet; Take 1 tablet (60 mg total) by mouth daily.  Dispense: 30 tablet; Refill: 11  5. Hypothyroidism  - levothyroxine (SYNTHROID, LEVOTHROID) 75 MCG tablet; Take 1 tablet (75 mcg total) by mouth daily before breakfast.  Dispense: 30 tablet; Refill: 5  6. Atrial fibrillation  - Rivaroxaban (XARELTO) 20 MG TABS tablet; Take 1 tablet (20 mg total) by mouth daily.  Dispense: 30  tablet; Refill: 4 -Continue all current medications Labs pending F/u in 3 months Discussed benefits of regular exercise and healthy eating Patient verbalized understanding Coralie Keens, FNP-C

## 2013-05-31 MED ORDER — METOPROLOL TARTRATE 50 MG PO TABS: 50.0000 mg | ORAL_TABLET | Freq: Two times a day (BID) | ORAL | Status: DC

## 2013-05-31 MED ORDER — LEVOTHYROXINE SODIUM 75 MCG PO TABS: 75.0000 ug | ORAL_TABLET | Freq: Every day | ORAL | Status: DC

## 2013-05-31 MED ORDER — METFORMIN HCL 500 MG PO TABS: 500.0000 mg | ORAL_TABLET | Freq: Two times a day (BID) | ORAL | Status: DC

## 2013-05-31 MED ORDER — HYDROCHLOROTHIAZIDE 25 MG PO TABS: 25.0000 mg | ORAL_TABLET | Freq: Every day | ORAL | Status: DC

## 2013-05-31 MED ORDER — ATORVASTATIN CALCIUM 10 MG PO TABS: 10.0000 mg | ORAL_TABLET | Freq: Every day | ORAL | Status: DC

## 2013-05-31 MED ORDER — RIVAROXABAN 20 MG PO TABS: 20.0000 mg | ORAL_TABLET | Freq: Every day | ORAL | Status: DC

## 2013-05-31 MED ORDER — RALOXIFENE HCL 60 MG PO TABS: 60.0000 mg | ORAL_TABLET | Freq: Every day | ORAL | Status: DC

## 2013-06-01 LAB — CMP14+EGFR
ALT: 8 IU/L (ref 0–32)
AST: 11 IU/L (ref 0–40)
Alkaline Phosphatase: 64 IU/L (ref 39–117)
CO2: 24 mmol/L (ref 18–29)
Chloride: 95 mmol/L — ABNORMAL LOW (ref 97–108)
GFR calc Af Amer: 74 mL/min/{1.73_m2} (ref >59)
GFR calc non Af Amer: 64 mL/min/{1.73_m2} (ref >59)
Glucose: 115 mg/dL — ABNORMAL HIGH (ref 65–99)
Potassium: 4 mmol/L (ref 3.5–5.2)
Sodium: 138 mmol/L (ref 134–144)
Total Bilirubin: 0.5 mg/dL (ref 0.0–1.2)
Total Protein: 6.4 g/dL (ref 6.0–8.5)

## 2013-06-01 LAB — NMR, LIPOPROFILE
LDL Particle Number: 542 nmol/L (ref <1000)
LDL Size: 21.1 nm (ref >20.5)
LP-IR Score: <25 (ref <=45)
Triglycerides by NMR: 88 mg/dL (ref <150)

## 2013-06-19 ENCOUNTER — Other Ambulatory Visit: Payer: Self-pay | Admitting: Cardiology

## 2013-07-03 ENCOUNTER — Encounter: Payer: Self-pay | Admitting: General Practice

## 2013-07-03 ENCOUNTER — Ambulatory Visit (INDEPENDENT_AMBULATORY_CARE_PROVIDER_SITE_OTHER): Payer: Medicare HMO | Admitting: General Practice

## 2013-07-03 VITALS — BP 118/52 | HR 67 | Temp 97.1°F | Wt 136.0 lb

## 2013-07-03 DIAGNOSIS — R350 Frequency of micturition: Secondary | ICD-10-CM

## 2013-07-03 DIAGNOSIS — IMO0001 Reserved for inherently not codable concepts without codable children: Secondary | ICD-10-CM

## 2013-07-03 DIAGNOSIS — N390 Urinary tract infection, site not specified: Secondary | ICD-10-CM

## 2013-07-03 LAB — POCT UA - MICROSCOPIC ONLY
CRYSTALS, UR, HPF, POC: NEGATIVE
Casts, Ur, LPF, POC: NEGATIVE
Mucus, UA: NEGATIVE
YEAST UA: NEGATIVE

## 2013-07-03 LAB — POCT URINALYSIS DIPSTICK
Bilirubin, UA: NEGATIVE
Glucose, UA: NEGATIVE
Ketones, UA: NEGATIVE
PH UA: 7.5
PROTEIN UA: NEGATIVE
Spec Grav, UA: <=1.005
Urobilinogen, UA: NEGATIVE

## 2013-07-03 MED ORDER — CIPROFLOXACIN HCL 500 MG PO TABS: 500.0000 mg | ORAL_TABLET | Freq: Two times a day (BID) | ORAL | Status: DC

## 2013-07-03 NOTE — Patient Instructions (Signed)
Urinary Tract Infection  Urinary tract infections (UTIs) can develop anywhere along your urinary tract. Your urinary tract is your body's drainage system for removing wastes and extra water. Your urinary tract includes two kidneys, two ureters, a bladder, and a urethra. Your kidneys are a pair of bean-shaped organs. Each kidney is about the size of your fist. They are located below your ribs, one on each side of your spine.  CAUSES  Infections are caused by microbes, which are microscopic organisms, including fungi, viruses, and bacteria. These organisms are so small that they can only be seen through a microscope. Bacteria are the microbes that most commonly cause UTIs.  SYMPTOMS   Symptoms of UTIs may vary by age and gender of the patient and by the location of the infection. Symptoms in young women typically include a frequent and intense urge to urinate and a painful, burning feeling in the bladder or urethra during urination. Older women and men are more likely to be tired, shaky, and weak and have muscle aches and abdominal pain. A fever may mean the infection is in your kidneys. Other symptoms of a kidney infection include pain in your back or sides below the ribs, nausea, and vomiting.  DIAGNOSIS  To diagnose a UTI, your caregiver will ask you about your symptoms. Your caregiver also will ask to provide a urine sample. The urine sample will be tested for bacteria and white blood cells. White blood cells are made by your body to help fight infection.  TREATMENT   Typically, UTIs can be treated with medication. Because most UTIs are caused by a bacterial infection, they usually can be treated with the use of antibiotics. The choice of antibiotic and length of treatment depend on your symptoms and the type of bacteria causing your infection.  HOME CARE INSTRUCTIONS   If you were prescribed antibiotics, take them exactly as your caregiver instructs you. Finish the medication even if you feel better after you  have only taken some of the medication.   Drink enough water and fluids to keep your urine clear or pale yellow.   Avoid caffeine, tea, and carbonated beverages. They tend to irritate your bladder.   Empty your bladder often. Avoid holding urine for long periods of time.   Empty your bladder before and after sexual intercourse.   After a bowel movement, women should cleanse from front to back. Use each tissue only once.  SEEK MEDICAL CARE IF:    You have back pain.   You develop a fever.   Your symptoms do not begin to resolve within 3 days.  SEEK IMMEDIATE MEDICAL CARE IF:    You have severe back pain or lower abdominal pain.   You develop chills.   You have nausea or vomiting.   You have continued burning or discomfort with urination.  MAKE SURE YOU:    Understand these instructions.   Will watch your condition.   Will get help right away if you are not doing well or get worse.  Document Released: 03/11/2005 Document Revised: 12/01/2011 Document Reviewed: 07/10/2011  [employer] Patient Information 2014 [employer]

## 2013-07-03 NOTE — Progress Notes (Signed)
   Subjective:    Patient ID: Kathy Robbins, female    DOB: [birth year], 80 y.o.   MRN: [id number]  Urinary Tract Infection  This is a new problem. The current episode started in the past 7 days. The problem has been gradually worsening. The quality of the pain is described as aching and burning. The pain is at a severity of 4/10. There has been no fever. She is not sexually active. There is no history of pyelonephritis. Associated symptoms include frequency and urgency. Pertinent negatives include no chills, flank pain, hematuria or nausea. She has tried increased fluids for the symptoms. The treatment provided no relief. Her past medical history is significant for recurrent UTIs.      Review of Systems  Constitutional: Negative for fever and chills.  Respiratory: Negative for chest tightness and shortness of breath.   Cardiovascular: Negative for chest pain and palpitations.  Gastrointestinal: Negative for nausea, abdominal pain, diarrhea, constipation and blood in stool.  Genitourinary: Positive for dysuria, urgency and frequency. Negative for hematuria, flank pain and difficulty urinating.  Neurological: Negative for dizziness, weakness and headaches.  All other systems reviewed and are negative.       Objective:   Physical Exam  Constitutional: She is oriented to person, place, and time. She appears well-developed and well-nourished.  Cardiovascular: Normal rate, regular rhythm and normal heart sounds.   Pulmonary/Chest: Effort normal and breath sounds normal.  Abdominal: Soft. Bowel sounds are normal. She exhibits no distension. There is tenderness in the suprapubic area. There is no CVA tenderness.  Neurological: She is alert and oriented to person, place, and time.  Skin: Skin is warm and dry.  Psychiatric: She has a normal mood and affect.     Results for orders placed in visit on 07/03/13  POCT URINALYSIS DIPSTICK      Result Value Range   Color, UA yellow     Clarity,  UA cloudy     Glucose, UA neg     Bilirubin, UA neg     Ketones, UA neg     Spec Grav, UA <=1.005     Blood, UA trace     pH, UA 7.5     Protein, UA neg     Urobilinogen, UA negative     Nitrite, UA +     Leukocytes, UA moderate (2+)    POCT UA - MICROSCOPIC ONLY      Result Value Range   WBC, Ur, HPF, POC 10-15     RBC, urine, microscopic 1-5     Bacteria, U Microscopic mod     Mucus, UA neg     Epithelial cells, urine per micros occ     Crystals, Ur, HPF, POC neg     Casts, Ur, LPF, POC neg     Yeast, UA neg          Assessment & Plan:  1. Frequency  - POCT urinalysis dipstick - POCT UA - Microscopic Only  2. UTI (urinary tract infection)  - Urine culture - ciprofloxacin (CIPRO) 500 MG tablet; Take 1 tablet (500 mg total) by mouth 2 (two) times daily.  Dispense: 20 tablet; Refill: 0 -Increase fluid intake AZO over the counter X2 days Frequent voiding Proper perineal hygiene RTO prn Culture pending Patient verbalized understanding Erby Pian, FNP-C

## 2013-07-17 ENCOUNTER — Other Ambulatory Visit: Payer: Self-pay | Admitting: Cardiology

## 2013-08-02 ENCOUNTER — Other Ambulatory Visit: Payer: Self-pay | Admitting: Vascular Surgery

## 2013-08-02 DIAGNOSIS — I6529 Occlusion and stenosis of unspecified carotid artery: Secondary | ICD-10-CM

## 2013-08-16 ENCOUNTER — Other Ambulatory Visit: Payer: Self-pay

## 2013-08-16 MED ORDER — BENAZEPRIL HCL 40 MG PO TABS: 40.0000 mg | ORAL_TABLET | Freq: Every day | ORAL | Status: DC

## 2013-08-16 MED ORDER — DILTIAZEM HCL ER COATED BEADS 300 MG PO CP24: 300.0000 mg | ORAL_CAPSULE | Freq: Every day | ORAL | Status: DC

## 2013-08-28 ENCOUNTER — Ambulatory Visit (INDEPENDENT_AMBULATORY_CARE_PROVIDER_SITE_OTHER): Payer: Medicare HMO | Admitting: General Practice

## 2013-09-11 ENCOUNTER — Encounter: Payer: Self-pay | Admitting: General Practice

## 2013-09-11 ENCOUNTER — Ambulatory Visit (INDEPENDENT_AMBULATORY_CARE_PROVIDER_SITE_OTHER): Payer: Medicare HMO | Admitting: General Practice

## 2013-09-11 VITALS — BP 198/79 | HR 79 | Temp 97.0°F | Ht 63.0 in | Wt 148.7 lb

## 2013-09-11 DIAGNOSIS — E039 Hypothyroidism, unspecified: Secondary | ICD-10-CM

## 2013-09-11 DIAGNOSIS — E785 Hyperlipidemia, unspecified: Secondary | ICD-10-CM

## 2013-09-11 DIAGNOSIS — E119 Type 2 diabetes mellitus without complications: Secondary | ICD-10-CM

## 2013-09-11 DIAGNOSIS — I10 Essential (primary) hypertension: Secondary | ICD-10-CM

## 2013-09-11 DIAGNOSIS — I4891 Unspecified atrial fibrillation: Secondary | ICD-10-CM

## 2013-09-11 LAB — POCT GLYCOSYLATED HEMOGLOBIN (HGB A1C): Hemoglobin A1C: 6

## 2013-09-11 MED ORDER — ATORVASTATIN CALCIUM 10 MG PO TABS: 10.0000 mg | ORAL_TABLET | Freq: Every day | ORAL | Status: DC

## 2013-09-11 MED ORDER — RIVAROXABAN 20 MG PO TABS: 20.0000 mg | ORAL_TABLET | Freq: Every day | ORAL | Status: DC

## 2013-09-11 MED ORDER — DILTIAZEM HCL ER COATED BEADS 300 MG PO CP24: 300.0000 mg | ORAL_CAPSULE | Freq: Every day | ORAL | Status: DC

## 2013-09-11 MED ORDER — BENAZEPRIL HCL 40 MG PO TABS: 40.0000 mg | ORAL_TABLET | Freq: Every day | ORAL | Status: DC

## 2013-09-11 MED ORDER — METFORMIN HCL 500 MG PO TABS: 500.0000 mg | ORAL_TABLET | Freq: Two times a day (BID) | ORAL | Status: DC

## 2013-09-11 MED ORDER — HYDROCHLOROTHIAZIDE 25 MG PO TABS: 25.0000 mg | ORAL_TABLET | Freq: Every day | ORAL | Status: DC

## 2013-09-11 MED ORDER — METOPROLOL TARTRATE 50 MG PO TABS: 50.0000 mg | ORAL_TABLET | Freq: Two times a day (BID) | ORAL | Status: DC

## 2013-09-11 NOTE — Progress Notes (Signed)
   Subjective:    Patient ID: Kathy Robbins, female    DOB: [birth year], 80 y.o.   MRN: [id number]  HPI Patient presents today for chronic health follow up. History of hypothyroidism, Hyperlipidemia, DM type 2, hypertension, and atrial fibrillation. Checking blood sugars daily and ranges in the 90-110's. Reports eating a healthy diet, denies exercise. She reports taking medications as directed     Review of Systems  Constitutional: Negative for fever and chills.  Eyes: Positive for visual disturbance. Negative for photophobia, pain and redness.       Vision disturbance from macular degeneration  Respiratory: Negative for chest tightness and shortness of breath.   Cardiovascular: Negative for chest pain and palpitations.  Gastrointestinal: Negative for nausea, vomiting, abdominal pain, constipation and blood in stool.  Genitourinary: Negative for dysuria, hematuria and difficulty urinating.  Musculoskeletal: Negative for back pain, neck pain and neck stiffness.  Neurological: Negative for dizziness, weakness and headaches.       Objective:   Physical Exam  Constitutional: She is oriented to person, place, and time. She appears well-developed and well-nourished.  HENT:  Head: Normocephalic and atraumatic.  Right Ear: External ear normal.  Left Ear: External ear normal.  Nose: Nose normal.  Mouth/Throat: Oropharynx is clear and moist.  Eyes: EOM are normal.  Cataract surgery both eyes and vision disturbance from macular degeneration  Neck: Normal range of motion. Neck supple. No thyromegaly present.  Cardiovascular: Normal rate, regular rhythm and normal heart sounds.   Pulmonary/Chest: Effort normal and breath sounds normal. No respiratory distress. She exhibits no tenderness.  Abdominal: Soft. Bowel sounds are normal. She exhibits no distension. There is no tenderness.  Musculoskeletal: She exhibits no edema and no tenderness.  Lymphadenopathy:    She has no cervical adenopathy.    Neurological: She is alert and oriented to person, place, and time.  Skin: Skin is warm and dry.  Psychiatric: She has a normal mood and affect.          Assessment & Plan:  1. Hyperlipidemia  - atorvastatin (LIPITOR) 10 MG tablet; Take 1 tablet (10 mg total) by mouth daily.  Dispense: 30 tablet; Refill: 4 - Lipid panel  2. Hypertension  - benazepril (LOTENSIN) 40 MG tablet; Take 1 tablet (40 mg total) by mouth daily.  Dispense: 30 tablet; Refill: 3 - diltiazem (CARDIZEM CD) 300 MG 24 hr capsule; Take 1 capsule (300 mg total) by mouth daily.  Dispense: 30 capsule; Refill: 3 - hydrochlorothiazide (HYDRODIURIL) 25 MG tablet; Take 1 tablet (25 mg total) by mouth daily.  Dispense: 30 tablet; Refill: 4 - metoprolol (LOPRESSOR) 50 MG tablet; Take 1 tablet (50 mg total) by mouth 2 (two) times daily.  Dispense: 60 tablet; Refill: 4 - CMP14+EGFR  3. Diabetes mellitus, type 2  - metFORMIN (GLUCOPHAGE) 500 MG tablet; Take 1 tablet (500 mg total) by mouth 2 (two) times daily with a meal.  Dispense: 60 tablet; Refill: 4 - POCT glycosylated hemoglobin (Hb A1C)  4. Atrial fibrillation  - Rivaroxaban (XARELTO) 20 MG TABS tablet; Take 1 tablet (20 mg total) by mouth daily.  Dispense: 30 tablet; Refill: 4  5. Hypothyroidism -Continue all current medications Labs pending F/u in 3 months Discussed benefits healthy eating Patient verbalized understanding Erby Pian, FNP-C

## 2013-09-11 NOTE — Patient Instructions (Signed)

## 2013-09-12 LAB — LIPID PANEL
CHOLESTEROL TOTAL: 121 mg/dL (ref 100–199)
Chol/HDL Ratio: 2.2 ratio units (ref 0.0–4.4)
HDL: 54 mg/dL (ref >39)
LDL CALC: 48 mg/dL (ref 0–99)
Triglycerides: 95 mg/dL (ref 0–149)
VLDL Cholesterol Cal: 19 mg/dL (ref 5–40)

## 2013-09-12 LAB — CMP14+EGFR
ALK PHOS: 86 IU/L (ref 39–117)
ALT: 7 IU/L (ref 0–32)
AST: 15 IU/L (ref 0–40)
Albumin/Globulin Ratio: 1.8 (ref 1.1–2.5)
Albumin: 4.1 g/dL (ref 3.5–4.7)
BUN / CREAT RATIO: 17 (ref 11–26)
BUN: 14 mg/dL (ref 8–27)
CO2: 25 mmol/L (ref 18–29)
Calcium: 9.5 mg/dL (ref 8.7–10.3)
Chloride: 95 mmol/L — ABNORMAL LOW (ref 97–108)
Creatinine, Ser: 0.83 mg/dL (ref 0.57–1.00)
GFR calc non Af Amer: 67 mL/min/{1.73_m2} (ref >59)
GFR, EST AFRICAN AMERICAN: 77 mL/min/{1.73_m2} (ref >59)
Globulin, Total: 2.3 g/dL (ref 1.5–4.5)
Glucose: 119 mg/dL — ABNORMAL HIGH (ref 65–99)
Potassium: 4.3 mmol/L (ref 3.5–5.2)
SODIUM: 137 mmol/L (ref 134–144)
Total Bilirubin: 0.6 mg/dL (ref 0.0–1.2)
Total Protein: 6.4 g/dL (ref 6.0–8.5)

## 2013-09-12 LAB — THYROID PANEL WITH TSH
FREE THYROXINE INDEX: 2.6 (ref 1.2–4.9)
T3 UPTAKE RATIO: 28 % (ref 24–39)
T4 TOTAL: 9.3 ug/dL (ref 4.5–12.0)
TSH: 3.35 u[IU]/mL (ref 0.450–4.500)

## 2013-11-08 ENCOUNTER — Ambulatory Visit (INDEPENDENT_AMBULATORY_CARE_PROVIDER_SITE_OTHER): Payer: Medicare HMO | Admitting: Family

## 2013-11-08 ENCOUNTER — Encounter: Payer: Self-pay | Admitting: Family

## 2013-11-08 VITALS — BP 150/78 | Temp 98.2°F | Ht 63.0 in | Wt 136.6 lb

## 2013-11-08 DIAGNOSIS — E119 Type 2 diabetes mellitus without complications: Secondary | ICD-10-CM

## 2013-11-08 DIAGNOSIS — I4891 Unspecified atrial fibrillation: Secondary | ICD-10-CM

## 2013-11-08 DIAGNOSIS — M810 Age-related osteoporosis without current pathological fracture: Secondary | ICD-10-CM

## 2013-11-08 DIAGNOSIS — E785 Hyperlipidemia, unspecified: Secondary | ICD-10-CM

## 2013-11-08 DIAGNOSIS — E039 Hypothyroidism, unspecified: Secondary | ICD-10-CM

## 2013-11-08 DIAGNOSIS — I10 Essential (primary) hypertension: Secondary | ICD-10-CM

## 2013-11-08 LAB — POCT GLYCOSYLATED HEMOGLOBIN (HGB A1C)

## 2013-11-08 MED ORDER — DILTIAZEM HCL ER COATED BEADS 300 MG PO CP24: 300.0000 mg | ORAL_CAPSULE | Freq: Every day | ORAL | Status: DC

## 2013-11-08 MED ORDER — METOPROLOL TARTRATE 50 MG PO TABS: 50.0000 mg | ORAL_TABLET | Freq: Two times a day (BID) | ORAL | Status: DC

## 2013-11-08 MED ORDER — HYDROCHLOROTHIAZIDE 25 MG PO TABS: 25.0000 mg | ORAL_TABLET | Freq: Every day | ORAL | Status: DC

## 2013-11-08 MED ORDER — ATORVASTATIN CALCIUM 10 MG PO TABS: 10.0000 mg | ORAL_TABLET | Freq: Every day | ORAL | Status: DC

## 2013-11-08 MED ORDER — RIVAROXABAN 20 MG PO TABS: 20.0000 mg | ORAL_TABLET | Freq: Every day | ORAL | Status: DC

## 2013-11-08 MED ORDER — BENAZEPRIL HCL 40 MG PO TABS: 40.0000 mg | ORAL_TABLET | Freq: Every day | ORAL | Status: DC

## 2013-11-08 MED ORDER — METFORMIN HCL 500 MG PO TABS: 500.0000 mg | ORAL_TABLET | Freq: Two times a day (BID) | ORAL | Status: DC

## 2013-11-08 MED ORDER — LEVOTHYROXINE SODIUM 75 MCG PO TABS: 75.0000 ug | ORAL_TABLET | Freq: Every day | ORAL | Status: DC

## 2013-11-08 MED ORDER — RALOXIFENE HCL 60 MG PO TABS: 60.0000 mg | ORAL_TABLET | Freq: Every day | ORAL | Status: DC

## 2013-11-08 NOTE — Patient Instructions (Signed)

## 2013-11-08 NOTE — Progress Notes (Signed)
Subjective:    Patient ID: Kathy Robbins, female    DOB: 11-Oct-1930, 80 y.o.   MRN: [id number]  Diabetes She presents for her follow-up diabetic visit. She has type 2 diabetes mellitus. Her disease course has been stable. There are no hypoglycemic associated symptoms. Pertinent negatives for hypoglycemia include no confusion, dizziness, headaches or seizures. There are no diabetic associated symptoms. Pertinent negatives for diabetes include no blurred vision, no foot paresthesias, no foot ulcerations, no polyuria and no visual change. There are no hypoglycemic complications. Risk factors for coronary artery disease include diabetes mellitus, dyslipidemia, hypertension and post-menopausal. Current diabetic treatment includes oral agent (monotherapy). She is compliant with treatment all of the time. Her weight is stable. She is following a generally healthy diet. She participates in exercise weekly. Her breakfast blood glucose range is generally 90-110 mg/dl. An ACE inhibitor/angiotensin II receptor blocker is being taken. She does not see a podiatrist.Eye exam is current.  Hypertension This is a chronic problem. The current episode started more than 1 year ago. The problem is controlled. Pertinent negatives include no blurred vision, headaches, palpitations, peripheral edema or shortness of breath. Agents associated with hypertension include thyroid hormones. Risk factors for coronary artery disease include diabetes mellitus, dyslipidemia and post-menopausal state. Past treatments include ACE inhibitors, beta blockers and diuretics. The current treatment provides moderate improvement. Compliance problems include exercise.  Hypertensive end-organ damage includes a thyroid problem. There is no history of kidney disease, CAD/MI or heart failure.  Hyperlipidemia This is a chronic problem. The current episode started more than 1 year ago. The problem is controlled. Recent lipid tests were reviewed and are  normal. Exacerbating diseases include diabetes and hypothyroidism. Factors aggravating her hyperlipidemia include beta blockers. Pertinent negatives include no focal sensory loss, leg pain or shortness of breath. Current antihyperlipidemic treatment includes statins. The current treatment provides moderate improvement of lipids. Risk factors for coronary artery disease include diabetes mellitus, dyslipidemia, hypertension and post-menopausal.  Thyroid Problem Presents for follow-up visit. Patient reports no cold intolerance, constipation, depressed mood, dry skin, hair loss, palpitations or visual change. The symptoms have been stable. Past treatments include levothyroxine. The treatment provided significant relief. Her past medical history is significant for diabetes and hyperlipidemia. There is no history of heart failure.  Atrial Fibrillation Pt currently taking xarelto daily with no complaints. Pt denies any palpations or SOB.    Review of Systems  HENT: Negative.   Eyes: Negative for blurred vision.  Respiratory: Negative for shortness of breath.   Cardiovascular: Negative for palpitations.  Gastrointestinal: Negative for constipation.  Endocrine: Negative for cold intolerance and polyuria.  Genitourinary: Negative.   Musculoskeletal: Negative.   Neurological: Negative for dizziness, seizures and headaches.  Psychiatric/Behavioral: Negative for confusion.  All other systems reviewed and are negative.      Objective:   Physical Exam  Vitals reviewed. Constitutional: She is oriented to person, place, and time. She appears well-developed and well-nourished. No distress.  HENT:  Head: Normocephalic and atraumatic.  Right Ear: External ear normal.  Left Ear: External ear normal.  Mouth/Throat: Oropharynx is clear and moist.  Eyes: Pupils are equal, round, and reactive to light.  Neck: Normal range of motion. Neck supple. No thyromegaly present.  Cardiovascular: Normal rate, regular  rhythm, normal heart sounds and intact distal pulses.   No murmur heard. Pulmonary/Chest: Effort normal. No respiratory distress. She has no wheezes.  Breaths sounds diminished bilaterally   Abdominal: Soft. Bowel sounds are normal. She exhibits no distension. There  is no tenderness.  Musculoskeletal: Normal range of motion. She exhibits no edema and no tenderness.  Neurological: She is alert and oriented to person, place, and time. No cranial nerve deficit.  Skin: Skin is warm and dry.  Psychiatric: She has a normal mood and affect. Her behavior is normal. Judgment and thought content normal.   BP 150/78  Temp(Src) 98.2 F (36.8 C) (Oral)  Ht 5' 3"  (1.6 m)  Wt 136 lb 9.6 oz (61.961 kg)  BMI 24.20 kg/m2        Assessment & Plan:  1. Hypertension - benazepril (LOTENSIN) 40 MG tablet; Take 1 tablet (40 mg total) by mouth daily.  Dispense: 30 tablet; Refill: 3 - diltiazem (CARDIZEM CD) 300 MG 24 hr capsule; Take 1 capsule (300 mg total) by mouth daily.  Dispense: 30 capsule; Refill: 3 - hydrochlorothiazide (HYDRODIURIL) 25 MG tablet; Take 1 tablet (25 mg total) by mouth daily.  Dispense: 30 tablet; Refill: 4 - metoprolol (LOPRESSOR) 50 MG tablet; Take 1 tablet (50 mg total) by mouth 2 (two) times daily.  Dispense: 60 tablet; Refill: 4 - CMP14+EGFR  2. Atrial fibrillation   3. Hypothyroidism - levothyroxine (SYNTHROID, LEVOTHROID) 75 MCG tablet; Take 1 tablet (75 mcg total) by mouth daily before breakfast.  Dispense: 30 tablet; Refill: 5 - TSH  4. Type 2 diabetes mellitus - POCT glycosylated hemoglobin (Hb A1C)  5. Hyperlipidemia - atorvastatin (LIPITOR) 10 MG tablet; Take 1 tablet (10 mg total) by mouth daily.  Dispense: 30 tablet; Refill: 4 - Lipid panel  6. Atrial fibrillation - rivaroxaban (XARELTO) 20 MG TABS tablet; Take 1 tablet (20 mg total) by mouth daily.  Dispense: 30 tablet; Refill: 4  7. Osteoporosis, unspecified - raloxifene (EVISTA) 60 MG tablet; Take 1  tablet (60 mg total) by mouth daily.  Dispense: 30 tablet; Refill: 11 - Vit D  25 hydroxy (rtn osteoporosis monitoring)  8. Diabetes mellitus, type 2 - metFORMIN (GLUCOPHAGE) 500 MG tablet; Take 1 tablet (500 mg total) by mouth 2 (two) times daily with a meal.  Dispense: 60 tablet; Refill: 4   Continue all meds Labs pending Health Maintenance reviewed Falls precautions discussed Diet and exercise encouraged RTO 3 months  Evelina Dun, FNP

## 2013-11-09 ENCOUNTER — Telehealth: Payer: Self-pay | Admitting: *Deleted

## 2013-11-09 LAB — LIPID PANEL
Chol/HDL Ratio: 2.1 ratio units (ref 0.0–4.4)
Cholesterol, Total: 104 mg/dL (ref 100–199)
HDL: 49 mg/dL (ref >39)
LDL CALC: 35 mg/dL (ref 0–99)
Triglycerides: 99 mg/dL (ref 0–149)
VLDL Cholesterol Cal: 20 mg/dL (ref 5–40)

## 2013-11-09 LAB — VITAMIN D 25 HYDROXY (VIT D DEFICIENCY, FRACTURES): VIT D 25 HYDROXY: 31.9 ng/mL (ref 30.0–100.0)

## 2013-11-09 LAB — CMP14+EGFR
A/G RATIO: 1.8 (ref 1.1–2.5)
ALK PHOS: 96 IU/L (ref 39–117)
ALT: 5 IU/L (ref 0–32)
AST: 12 IU/L (ref 0–40)
Albumin: 3.9 g/dL (ref 3.5–4.7)
BILIRUBIN TOTAL: 0.4 mg/dL (ref 0.0–1.2)
BUN/Creatinine Ratio: 16 (ref 11–26)
BUN: 15 mg/dL (ref 8–27)
CHLORIDE: 97 mmol/L (ref 97–108)
CO2: 25 mmol/L (ref 18–29)
CREATININE: 0.92 mg/dL (ref 0.57–1.00)
Calcium: 9.2 mg/dL (ref 8.7–10.3)
GFR calc Af Amer: 68 mL/min/{1.73_m2} (ref >59)
GFR calc non Af Amer: 59 mL/min/{1.73_m2} — ABNORMAL LOW (ref >59)
Globulin, Total: 2.2 g/dL (ref 1.5–4.5)
Glucose: 108 mg/dL — ABNORMAL HIGH (ref 65–99)
Potassium: 3.8 mmol/L (ref 3.5–5.2)
SODIUM: 139 mmol/L (ref 134–144)
TOTAL PROTEIN: 6.1 g/dL (ref 6.0–8.5)

## 2013-11-09 LAB — TSH: TSH: 0.406 u[IU]/mL — AB (ref 0.450–4.500)

## 2013-11-09 NOTE — Telephone Encounter (Signed)
Message copied by Shelbie Ammons on Thu Nov 09, 2013 12:24 PM ------      Message from: Lenna Gilford, [place] A      Created: Thu Nov 09, 2013 11:06 AM       Hgb A1C was 6.3%-WNL      Kidney and liver function stable      Cholesterol WNL      Thyroid hormones are stable-May need to adjust medication on next blood draw      Vit D levels are on low side of normal-Would suggest to start on VIT D OTC ------

## 2013-11-09 NOTE — Telephone Encounter (Signed)
Aware,labs good. Start taking your vitamin d again.

## 2013-12-11 ENCOUNTER — Ambulatory Visit: Payer: Medicare HMO | Admitting: General Practice

## 2014-01-02 ENCOUNTER — Encounter: Payer: Self-pay | Admitting: *Deleted

## 2014-01-24 ENCOUNTER — Encounter: Payer: Self-pay | Admitting: Family

## 2014-01-25 ENCOUNTER — Ambulatory Visit (INDEPENDENT_AMBULATORY_CARE_PROVIDER_SITE_OTHER): Payer: Medicare PPO | Admitting: Family

## 2014-01-25 ENCOUNTER — Other Ambulatory Visit: Payer: Self-pay | Admitting: Vascular Surgery

## 2014-01-25 ENCOUNTER — Ambulatory Visit (HOSPITAL_COMMUNITY)
Admission: RE | Admit: 2014-01-25 | Discharge: 2014-01-25 | Disposition: A | Discharge status: Home / Self Care | Payer: Medicare PPO | Source: Ambulatory Visit | Attending: Family | Admitting: Family

## 2014-01-25 ENCOUNTER — Encounter: Payer: Self-pay | Admitting: Family

## 2014-01-25 VITALS — BP 146/60 | HR 52 | Resp 16 | Ht 63.0 in | Wt 167.0 lb

## 2014-01-25 DIAGNOSIS — I6529 Occlusion and stenosis of unspecified carotid artery: Secondary | ICD-10-CM

## 2014-01-25 DIAGNOSIS — G458 Other transient cerebral ischemic attacks and related syndromes: Secondary | ICD-10-CM | POA: Diagnosis present

## 2014-01-25 NOTE — Progress Notes (Signed)
Established Carotid Patient   History of Present Illness  Kathy Robbins is a 80 y.o. female patient of Dr. Oneida Alar followed for known carotid stenosis. She had radiation therapy to her throat in 2007 for throat cancer. She also had chemotherapy.  Patient has not had previous carotid artery intervention.  Patient has Negative history of TIA or stroke symptom.  The patient denies amaurosis fugax or monocular blindness.  The patient  denies facial drooping.  Pt. denies hemiplegia.  The patient denies receptive or expressive aphasia.  She denies dizziness, denies tingling, numbness, weakness, or pain in either arm.   Pt denies New Medical or Surgical History. She uses a cane for the sight impaired, states she is not blind but has vision loss from macular degeneration. Pt denies claudication symptoms with walking, denies non healing wounds.   Pt Diabetic: Yes, 6.3 A1C June, 2015 Pt smoker: former smoker, quit in 1965  Pt meds include: Statin : Yes ASA: Yes Other anticoagulants/antiplatelets: Xarelto, has hx of atrial fib.   Past Medical History  Diagnosis Date  . HTN (hypertension)   . History of colon cancer   . Atrial fibrillation or flutter   . Hyperlipidemia   . Thyroid disease     hypothyroidism  . Diabetes mellitus     NIDDM  . Osteopenia   . Arthritis   . Cancer July 2013    Throat    Social History History  Substance Use Topics  . Smoking status: Former Smoker    Quit date: 06/16/1963  . Smokeless tobacco: Never Used     Comment: quit in 1995  . Alcohol Use: No    Family History Family History  Problem Relation Age of Onset  . Cancer Mother     "female organs"  . Stroke Mother 66    Surgical History Past Surgical History  Procedure Laterality Date  . 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 Lymph node biopsy      removal  . Colonoscopy  Jan. 2013    6 polyps  removed    Allergies  Allergen Reactions  . Iohexol       Code: HIVES, Desc: AFTER CT PT HAD HIVES,SOME SOB TREATED BY MD [facility] NEEDS PRE.MEDS.NEXT TIME, Onset Date: [id number]     Current Outpatient Prescriptions  Medication Sig Dispense Refill  . atorvastatin (LIPITOR) 10 MG tablet Take 1 tablet (10 mg total) by mouth daily.  30 tablet  4  . benazepril (LOTENSIN) 40 MG tablet Take 1 tablet (40 mg total) by mouth daily.  30 tablet  3  . calcium carbonate (OS-CAL) 600 MG TABS Take 600 mg by mouth daily.      . cholecalciferol (VITAMIN D) 1000 UNITS tablet Take 1,000 Units by mouth daily.      Marland Kitchen diltiazem (CARDIZEM CD) 300 MG 24 hr capsule Take 1 capsule (300 mg total) by mouth daily.  30 capsule  3  . fish oil-omega-3 fatty acids 1000 MG capsule Take 1 g by mouth daily.        . hydrochlorothiazide (HYDRODIURIL) 25 MG tablet Take 1 tablet (25 mg total) by mouth daily.  30 tablet  4  . levothyroxine (SYNTHROID, LEVOTHROID) 75 MCG tablet Take 1 tablet (75 mcg total) by mouth daily before breakfast.  30 tablet  5  . metFORMIN (GLUCOPHAGE) 500 MG tablet Take 1 tablet (500 mg total) by mouth 2 (two) times daily with a meal.  60 tablet  4  . metoprolol (LOPRESSOR) 50 MG tablet Take 1 tablet (50 mg total) by mouth 2 (two) times daily.  60 tablet  4  . Multiple Vitamins-Minerals (CENTRUM SILVER PO) Take by mouth daily. For Women      . raloxifene (EVISTA) 60 MG tablet Take 1 tablet (60 mg total) by mouth daily.  30 tablet  11  . rivaroxaban (XARELTO) 20 MG TABS tablet Take 1 tablet (20 mg total) by mouth daily.  30 tablet  4  . docusate sodium (COLACE) 50 MG capsule Take by mouth as needed.         No current facility-administered medications for this visit.    Review of Systems : See HPI for pertinent positives and negatives.  Physical Examination  Filed Vitals:   01/25/14 1612 01/25/14 1620  BP: 98/62 146/60  Pulse: 53 52  Resp:  16  Height:  5\' 3"  (1.6 m)  Weight:  167 lb (75.751 kg)  SpO2:  98%   Body mass index is 29.59  kg/(m^2).  General: WDWN female in NAD GAIT: normal, using cane for the sight impaired Eyes: PERRLA Pulmonary:  Non-labored, CTAB, Negative  Rales, Negative rhonchi, & Negative wheezing.  Cardiac: irregular Rhythm   VASCULAR EXAM Carotid Bruits Right Left   Negative Negative    Aorta is not palpable. Radial pulses are 1+ right, 2+ left palpable.                                                                                                                            LE Pulses Right Left       POPLITEAL  not palpable   not palpable       POSTERIOR TIBIAL  not palpable   not palpable        DORSALIS PEDIS      ANTERIOR TIBIAL faintly palpable  faintly palpable     Gastrointestinal: soft, nontender, BS WNL, no r/g,  negative masses palpated.  Musculoskeletal: age appropriate muscle atrophy/wasting. M/S 4/5 throughout, Extremities without ischemic changes.  Neurologic: A&O X 3; Appropriate Affect ; SENSATION ;normal;  Speech is normal CN 2-12 intact, Pain and light touch intact in extremities, Motor exam as listed above.   Non-Invasive Vascular Imaging CAROTID DUPLEX 01/25/2014   CEREBROVASCULAR DUPLEX EVALUATION    INDICATION: Carotid stenosis    PREVIOUS INTERVENTION(S): N/A    DUPLEX EXAM:     RIGHT  LEFT  Peak Systolic Velocities (cm/s) End Diastolic Velocities (cm/s) Plaque LOCATION Peak Systolic Velocities (cm/s) End Diastolic Velocities (cm/s) Plaque  0/61 0 occluded/B CCA PROXIMAL 75 8 HT  31 0 thrombus/plaque CCA MID 86 9 HT  38 0 CP CCA DISTAL 73 7 HT  200 45 CP ECA 65 0 HT  45 0 CP/Retro-grade ICA PROXIMAL 93 16 HT  51  retro-grade ICA MID 144 16   0  occluded ICA DISTAL 115 12     ICA occlusion ICA /  CCA Ratio (PSV) 1.67  Retrograde Vertebral Flow Antegrade  94 Brachial Systolic Pressure (mmHg) 578  Monophasic Brachial Artery Waveforms Triphasic    Plaque Morphology:  HM = Homogeneous, HT = Heterogeneous, CP = Calcific Plaque, SP = Smooth Plaque,  IP = Irregular Plaque       IMPRESSION: Dampened abnormal flow present involving the proximal common carotid artery suggestive of hemodynamically significant proximal stenosis versus occlusion. Abnormal retrograde flow present involving the right internal carotid artery and vertebral artery, with distal right internal carotid artery occlusion noted. Elevated right external carotid artery velocities which may be overestimated due to compensatory flow. Left internal carotid artery stenosis present in the less than 40% range. Abnormal difference in brachial pressures consistent with known right subclavian steal syndrome.    Compared to the previous exam:  Increase in disease on the right and stable on the left since previous study on 01/19/2013.      Assessment: Oneida Mckamey is a 80 y.o. female who has no history of stroke or TIA. She has no steal symptoms in either arm or hand, no dizziness. There is a 48-64 points difference in systolic brachial pressures. Today's carotid Duplex exam demonstrates dampened abnormal flow present involving the proximal common carotid artery suggestive of hemodynamically significant proximal stenosis versus occlusion. Abnormal retrograde flow present involving the right internal carotid artery and vertebral artery, with distal right internal carotid artery occlusion noted. Elevated right external carotid artery velocities which may be overestimated due to compensatory flow. Left internal carotid artery stenosis present in the less than 40% range. Abnormal difference in brachial pressures consistent with known right subclavian steal syndrome. Increase in disease on the right and stable on the left since previous study on 01/19/2013.   Plan: Based on today's exam and carotid Duplex, and after discussing with Dr. Bridgett Larsson, pt will follow-up with Dr. Oneida Alar on 02/01/14 to discuss further options.   I discussed in depth with the patient the nature of atherosclerosis,  and emphasized the importance of maximal medical management including strict control of blood pressure, blood glucose, and lipid levels, obtaining regular exercise, and continued cessation of smoking.  The patient is aware that without maximal medical management the underlying atherosclerotic disease process will progress, limiting the benefit of any interventions. The patient was given information about stroke prevention and what symptoms should prompt the patient to seek immediate medical care. Thank you for allowing [place] to participate in this patient's care.  Clemon Chambers, RN, MSN, FNP-C [facility] Office: [facility] Physician: Bridgett Larsson  01/25/2014 4:45 PM

## 2014-01-31 ENCOUNTER — Encounter: Payer: Self-pay | Admitting: Vascular Surgery

## 2014-02-01 ENCOUNTER — Ambulatory Visit (INDEPENDENT_AMBULATORY_CARE_PROVIDER_SITE_OTHER): Payer: Medicare PPO | Admitting: Vascular Surgery

## 2014-02-01 ENCOUNTER — Encounter: Payer: Self-pay | Admitting: Vascular Surgery

## 2014-02-01 VITALS — BP 142/58 | HR 63 | Resp 18 | Ht 63.0 in | Wt 129.0 lb

## 2014-02-01 DIAGNOSIS — I6529 Occlusion and stenosis of unspecified carotid artery: Secondary | ICD-10-CM

## 2014-02-01 NOTE — Addendum Note (Signed)
Addended by: Mena Goes on: 02/01/2014 02:11 PM   Modules accepted: Orders

## 2014-02-01 NOTE — Progress Notes (Signed)
Patient is an 80 year old female that we have followed for some time for right carotid and innominate artery stenosis. She previously had radiation to this area after removal of a cancer in her right neck. She denies any symptoms of TIA amaurosis or stroke. She had a surveillance carotid duplex ultrasound performed on August 13. This showed right internal carotid artery occlusion high-grade right common carotid stenosis versus occlusion retrograde right internal carotid artery and vertebral flow left internal carotid artery stenosis less than 40%. The patient is a former smoker but has quit. She in any mass in the right side of her neck. She is on Xarelto for atrial fibrillation. Other chronic medical problems include hyperlipidemia, diabetes, arthritis all of which are currently controlled.  Past Medical History  Diagnosis Date  . HTN (hypertension)   . History of colon cancer   . Atrial fibrillation or flutter   . Hyperlipidemia   . Thyroid disease     hypothyroidism  . Diabetes mellitus     NIDDM  . Osteopenia   . Arthritis   . Cancer July 2013    Throat    Past Surgical History  Procedure Laterality Date  . 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 Lymph node biopsy      removal  . Colonoscopy  Jan. 2013    6 polyps  removed   Review of systems:  She has shortness of breath with exertion. She denies chest pain.  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 She has no right upper extremity symptoms. She has had no stroke symptoms. She has minimal stenosis on the left common carotid and left internal carotid. We will schedule her for a repeat carotid duplex scan in 6 months time. She'll return sooner she has any symptoms of TIA amaurosis or stroke. She'll continue her Xarelto. The patient should have her blood pressure checks in her left arm as her right arm blood pressure checks will be and inaccurately low.  Ruta Hinds, MD [facility] Office: [phone] Pager: [phone]

## 2014-02-12 ENCOUNTER — Ambulatory Visit (INDEPENDENT_AMBULATORY_CARE_PROVIDER_SITE_OTHER): Payer: Medicare HMO | Admitting: Family

## 2014-02-12 ENCOUNTER — Encounter: Payer: Self-pay | Admitting: Family

## 2014-02-12 VITALS — BP 155/75 | HR 84 | Temp 97.4°F | Ht 63.0 in | Wt 132.8 lb

## 2014-02-12 DIAGNOSIS — E119 Type 2 diabetes mellitus without complications: Secondary | ICD-10-CM

## 2014-02-12 DIAGNOSIS — Z1321 Encounter for screening for nutritional disorder: Secondary | ICD-10-CM

## 2014-02-12 DIAGNOSIS — I10 Essential (primary) hypertension: Secondary | ICD-10-CM

## 2014-02-12 DIAGNOSIS — E039 Hypothyroidism, unspecified: Secondary | ICD-10-CM

## 2014-02-12 DIAGNOSIS — I4891 Unspecified atrial fibrillation: Secondary | ICD-10-CM

## 2014-02-12 DIAGNOSIS — E785 Hyperlipidemia, unspecified: Secondary | ICD-10-CM

## 2014-02-12 LAB — POCT GLYCOSYLATED HEMOGLOBIN (HGB A1C): Hemoglobin A1C: 6.2

## 2014-02-12 NOTE — Patient Instructions (Signed)

## 2014-02-12 NOTE — Progress Notes (Signed)
Subjective:    Patient ID: Kathy Robbins, female    DOB: 1931-02-16, 80 y.o.   MRN: [id number]  Diabetes She presents for her follow-up diabetic visit. She has type 2 diabetes mellitus. Her disease course has been stable. There are no hypoglycemic associated symptoms. Pertinent negatives for hypoglycemia include no confusion, dizziness, headaches or seizures. There are no diabetic associated symptoms. Pertinent negatives for diabetes include no blurred vision, no foot paresthesias, no foot ulcerations, no polyuria and no visual change. There are no hypoglycemic complications. Pertinent negatives for diabetic complications include no CVA, heart disease, nephropathy or peripheral neuropathy. Risk factors for coronary artery disease include diabetes mellitus, dyslipidemia, hypertension and post-menopausal. Current diabetic treatment includes oral agent (monotherapy). She is compliant with treatment all of the time. Her weight is stable. She is following a generally healthy diet. She participates in exercise weekly. Her breakfast blood glucose range is generally 90-110 mg/dl. An ACE inhibitor/angiotensin II receptor blocker is being taken. She does not see a podiatrist.Eye exam is current.  Hypertension This is a chronic problem. The current episode started more than 1 year ago. The problem has been waxing and waning since onset. The problem is uncontrolled. Pertinent negatives include no blurred vision, headaches, palpitations, peripheral edema or shortness of breath. Agents associated with hypertension include thyroid hormones. Risk factors for coronary artery disease include diabetes mellitus, dyslipidemia and post-menopausal state. Past treatments include ACE inhibitors, beta blockers and diuretics. The current treatment provides moderate improvement. Compliance problems include exercise.  Hypertensive end-organ damage includes a thyroid problem. There is no history of kidney disease, CAD/MI, CVA or heart  failure.  Hyperlipidemia This is a chronic problem. The current episode started more than 1 year ago. The problem is controlled. Recent lipid tests were reviewed and are normal. Exacerbating diseases include diabetes and hypothyroidism. Factors aggravating her hyperlipidemia include beta blockers. Pertinent negatives include no focal sensory loss, leg pain or shortness of breath. Current antihyperlipidemic treatment includes statins. The current treatment provides moderate improvement of lipids. Risk factors for coronary artery disease include diabetes mellitus, dyslipidemia, hypertension and post-menopausal.  Thyroid Problem Presents for follow-up visit. Patient reports no cold intolerance, constipation, depressed mood, dry skin, hair loss, palpitations or visual change. The symptoms have been stable. Past treatments include levothyroxine. The treatment provided significant relief. Her past medical history is significant for diabetes and hyperlipidemia. There is no history of heart failure.      Review of Systems  Constitutional: Negative.   HENT: Negative.   Eyes: Negative.  Negative for blurred vision.  Respiratory: Negative.  Negative for shortness of breath.   Cardiovascular: Negative.  Negative for palpitations.  Gastrointestinal: Negative.  Negative for constipation.  Endocrine: Negative.  Negative for cold intolerance and polyuria.  Genitourinary: Negative.   Musculoskeletal: Negative.   Neurological: Negative.  Negative for dizziness, seizures and headaches.  Hematological: Negative.   Psychiatric/Behavioral: Negative.  Negative for confusion.  All other systems reviewed and are negative.      Objective:   Physical Exam  Vitals reviewed. Constitutional: She is oriented to person, place, and time. She appears well-developed and well-nourished. No distress.  HENT:  Head: Normocephalic and atraumatic.  Right Ear: External ear normal.  Mouth/Throat: Oropharynx is clear and  moist.  Eyes: Pupils are equal, round, and reactive to light.  Neck: Normal range of motion. Neck supple. No thyromegaly present.  Cardiovascular: Normal rate, regular rhythm, normal heart sounds and intact distal pulses.   No murmur heard. Pulmonary/Chest: Effort normal  and breath sounds normal. No respiratory distress. She has no wheezes.  Abdominal: Soft. Bowel sounds are normal. She exhibits no distension. There is no tenderness.  Musculoskeletal: Normal range of motion. She exhibits no edema and no tenderness.  Neurological: She is alert and oriented to person, place, and time. She has normal reflexes. No cranial nerve deficit.  Skin: Skin is warm and dry.  Psychiatric: She has a normal mood and affect. Her behavior is normal. Judgment and thought content normal.    *See diabetic foot note  BP 171/77  Pulse 84  Temp(Src) 97.4 F (36.3 C) (Oral)  Ht 5' 3"  (1.6 m)  Wt 132 lb 12.8 oz (60.238 kg)  BMI 23.53 kg/m2     Assessment & Plan:  1. Atrial fibrillation  - CMP14+EGFR  2. Essential hypertension - CMP14+EGFR  3. Hyperlipidemia - CMP14+EGFR - Lipid panel  4. Type 2 diabetes mellitus without complication - POCT UA - Microalbumin - CMP14+EGFR - POCT glycosylated hemoglobin (Hb A1C)  5. Hypothyroidism, unspecified hypothyroidism type - CMP14+EGFR - Thyroid Panel With TSH  6. Encounter for vitamin deficiency screening - Vit D  25 hydroxy (rtn osteoporosis monitoring)   Continue all meds Labs pending Health Maintenance reviewed Diet and exercise encouraged RTO 3 months  Evelina Dun, FNP

## 2014-02-12 NOTE — Addendum Note (Signed)
Addended by: Pollyann Kennedy F on: 02/12/2014 09:17 AM   Modules accepted: Orders

## 2014-02-13 LAB — LIPID PANEL
CHOL/HDL RATIO: 3 ratio (ref 0.0–4.4)
Cholesterol, Total: 145 mg/dL (ref 100–199)
HDL: 49 mg/dL (ref >39)
LDL Calculated: 69 mg/dL (ref 0–99)
Triglycerides: 133 mg/dL (ref 0–149)
VLDL Cholesterol Cal: 27 mg/dL (ref 5–40)

## 2014-02-13 LAB — CMP14+EGFR
A/G RATIO: 1.6 (ref 1.1–2.5)
ALK PHOS: 93 IU/L (ref 39–117)
ALT: 11 IU/L (ref 0–32)
AST: 17 IU/L (ref 0–40)
Albumin: 3.8 g/dL (ref 3.5–4.7)
BILIRUBIN TOTAL: 0.5 mg/dL (ref 0.0–1.2)
BUN / CREAT RATIO: 16 (ref 11–26)
BUN: 15 mg/dL (ref 8–27)
CO2: 26 mmol/L (ref 18–29)
Calcium: 9.4 mg/dL (ref 8.7–10.3)
Chloride: 95 mmol/L — ABNORMAL LOW (ref 97–108)
Creatinine, Ser: 0.95 mg/dL (ref 0.57–1.00)
GFR calc Af Amer: 65 mL/min/{1.73_m2} (ref >59)
GFR, EST NON AFRICAN AMERICAN: 57 mL/min/{1.73_m2} — AB (ref >59)
GLOBULIN, TOTAL: 2.4 g/dL (ref 1.5–4.5)
Glucose: 100 mg/dL — ABNORMAL HIGH (ref 65–99)
POTASSIUM: 4.2 mmol/L (ref 3.5–5.2)
SODIUM: 138 mmol/L (ref 134–144)
Total Protein: 6.2 g/dL (ref 6.0–8.5)

## 2014-02-13 LAB — VITAMIN D 25 HYDROXY (VIT D DEFICIENCY, FRACTURES): VIT D 25 HYDROXY: 34.3 ng/mL (ref 30.0–100.0)

## 2014-02-13 LAB — THYROID PANEL WITH TSH
FREE THYROXINE INDEX: 2.3 (ref 1.2–4.9)
T3 UPTAKE RATIO: 30 % (ref 24–39)
T4, Total: 7.6 ug/dL (ref 4.5–12.0)
TSH: 1.15 u[IU]/mL (ref 0.450–4.500)

## 2014-02-15 ENCOUNTER — Telehealth: Payer: Self-pay | Admitting: Family

## 2014-02-15 NOTE — Telephone Encounter (Signed)
Pt called back to let nurse know she spoke to her pharmacist and they helped her and answered her question about her supplement.  She does not need nurse to call her back.  rs

## 2014-05-07 ENCOUNTER — Other Ambulatory Visit: Payer: Self-pay | Admitting: Family

## 2014-05-17 ENCOUNTER — Ambulatory Visit (INDEPENDENT_AMBULATORY_CARE_PROVIDER_SITE_OTHER): Payer: Medicare HMO | Admitting: *Deleted

## 2014-05-17 ENCOUNTER — Ambulatory Visit (INDEPENDENT_AMBULATORY_CARE_PROVIDER_SITE_OTHER): Payer: Medicare HMO | Admitting: Family Medicine

## 2014-05-17 ENCOUNTER — Encounter: Payer: Self-pay | Admitting: Family Medicine

## 2014-05-17 ENCOUNTER — Telehealth: Payer: Self-pay

## 2014-05-17 VITALS — BP 158/82 | Temp 96.4°F | Ht 63.0 in | Wt 132.4 lb

## 2014-05-17 DIAGNOSIS — Z23 Encounter for immunization: Secondary | ICD-10-CM

## 2014-05-17 DIAGNOSIS — E785 Hyperlipidemia, unspecified: Secondary | ICD-10-CM

## 2014-05-17 DIAGNOSIS — E119 Type 2 diabetes mellitus without complications: Secondary | ICD-10-CM

## 2014-05-17 DIAGNOSIS — I10 Essential (primary) hypertension: Secondary | ICD-10-CM

## 2014-05-17 LAB — POCT GLYCOSYLATED HEMOGLOBIN (HGB A1C): Hemoglobin A1C: 6.1

## 2014-05-17 NOTE — Telephone Encounter (Signed)
Letter sent with results

## 2014-05-17 NOTE — Telephone Encounter (Signed)
-----   Message from Wardell Honour, MD sent at 05/17/2014 11:01 AM EST ----- Hemoglobin A1c within target range. No changes are recommended

## 2014-05-17 NOTE — Progress Notes (Signed)
   Subjective:    Patient ID: Kathy Robbins, female    DOB: [birth year], 80 y.o.   MRN: [id number]  HPI  80 year old female here to follow-up diabetes atrial fibrillation and hyperlipidemia. Patient lives alone and does not drive any longer due to macular degeneration. There are no cognitive issues that I can tell today. She reports good compliance with her medicines and diet. She uses a broom handle for a cane. She denies any falls.    Review of Systems  Constitutional: Negative.   HENT: Negative.   Respiratory: Negative.   Cardiovascular: Negative.   Gastrointestinal: Negative.   Genitourinary: Negative.   Neurological: Negative.        Objective:   Physical Exam  Constitutional: She is oriented to person, place, and time. She appears well-developed and well-nourished.  Eyes: Conjunctivae and EOM are normal.  Neck: Normal range of motion. Neck supple.  Cardiovascular: Normal rate and normal heart sounds.   Irregular rhythm consistent with atrial fibrillation  Pulmonary/Chest: Effort normal and breath sounds normal.  Abdominal: Soft. Bowel sounds are normal.  Musculoskeletal: Normal range of motion.  Neurological: She is alert and oriented to person, place, and time. She has normal reflexes.  Skin: Skin is warm and dry.  Psychiatric: She has a normal mood and affect. Her behavior is normal. Thought content normal.   BP 158/82 mmHg  Temp(Src) 96.4 F (35.8 C) (Oral)  Ht 5\' 3"  (1.6 m)  Wt 132 lb 6.4 oz (60.056 kg)  BMI 23.46 kg/m2       Assessment & Plan:  1. Type 2 diabetes mellitus without complication  - POCT glycosylated hemoglobin (Hb A1C)  2. Hyperlipidemia Labs at goal at last visit we will check yearly  3. Essential hypertension   Wardell Honour MD

## 2014-06-04 ENCOUNTER — Other Ambulatory Visit: Payer: Self-pay | Admitting: Family

## 2014-07-02 ENCOUNTER — Other Ambulatory Visit: Payer: Self-pay | Admitting: General Practice

## 2014-07-02 ENCOUNTER — Other Ambulatory Visit: Payer: Self-pay | Admitting: Family

## 2014-07-03 NOTE — Telephone Encounter (Signed)
Per protocol, next appt set for 08/2014

## 2014-08-02 ENCOUNTER — Telehealth: Payer: Self-pay | Admitting: Family Medicine

## 2014-08-02 NOTE — Telephone Encounter (Signed)
Patients when she wiped nose she had a little bit of blood on her tissue and was concerned she said that it had stopped and I advised her that if it happens again then we need to see her.

## 2014-08-06 ENCOUNTER — Other Ambulatory Visit: Payer: Self-pay | Admitting: General Practice

## 2014-08-08 ENCOUNTER — Encounter: Payer: Self-pay | Admitting: Family

## 2014-08-09 ENCOUNTER — Ambulatory Visit (INDEPENDENT_AMBULATORY_CARE_PROVIDER_SITE_OTHER): Payer: Medicare PPO | Admitting: Family

## 2014-08-09 ENCOUNTER — Ambulatory Visit (HOSPITAL_COMMUNITY)
Admission: RE | Admit: 2014-08-09 | Discharge: 2014-08-09 | Disposition: A | Discharge status: Home / Self Care | Payer: Medicare PPO | Source: Ambulatory Visit | Attending: Family | Admitting: Family

## 2014-08-09 ENCOUNTER — Encounter: Payer: Self-pay | Admitting: Family

## 2014-08-09 VITALS — BP 118/60 | HR 64 | Resp 14 | Ht 63.0 in | Wt 132.0 lb

## 2014-08-09 DIAGNOSIS — I6523 Occlusion and stenosis of bilateral carotid arteries: Secondary | ICD-10-CM | POA: Insufficient documentation

## 2014-08-09 DIAGNOSIS — Z87891 Personal history of nicotine dependence: Secondary | ICD-10-CM

## 2014-08-09 NOTE — Progress Notes (Signed)
Established Carotid Patient   History of Present Illness  Kathy Robbins is a 81 y.o. female patient of Dr. Oneida Alar followed for known right carotid and innominate artery stenosis. She had radiation therapy to her throat in 2007 for throat cancer. She also had chemotherapy.  Patient has not had previous carotid artery intervention.  Patient has Negative history of TIA or stroke symptom. The patient denies amaurosis fugax or monocular blindness. The patient denies facial drooping. Pt. denies hemiplegia. The patient denies receptive or expressive aphasia.  She denies dizziness, denies tingling, numbness, weakness, or pain in either arm.   Pt denies New Medical or Surgical History. She uses a cane for the sight impaired, states she is not blind but has vision loss from macular degeneration. Pt denies claudication symptoms with walking, denies non healing wounds.   Pt Diabetic: Yes, 6.1 A1C December 2015 (review of records) Pt smoker: former smoker, quit in 1995  Pt meds include: Statin : Yes ASA: Yes Other anticoagulants/antiplatelets: Xarelto, has hx of atrial fib.    Past Medical History  Diagnosis Date  . HTN (hypertension)   . History of colon cancer   . Atrial fibrillation or flutter   . Hyperlipidemia   . Thyroid disease     hypothyroidism  . Diabetes mellitus     NIDDM  . Osteopenia   . Arthritis   . Cancer July 2013    Throat    Social History History  Substance Use Topics  . Smoking status: Former Smoker    Quit date: 06/16/1963  . Smokeless tobacco: Never Used     Comment: quit in 1995  . Alcohol Use: No    Family History Family History  Problem Relation Age of Onset  . Cancer Mother     "female organs"  . Stroke Mother 34    Surgical History Past Surgical History  Procedure Laterality Date  . 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 Lymph node biopsy      removal  . Colonoscopy  Jan. 2013    6 polyps   removed    Allergies  Allergen Reactions  . Iohexol      Code: HIVES, Desc: AFTER CT PT HAD HIVES,SOME SOB TREATED BY MD [facility] NEEDS PRE.MEDS.NEXT TIME, Onset Date: [id number]     Current Outpatient Prescriptions  Medication Sig Dispense Refill  . atorvastatin (LIPITOR) 10 MG tablet Take 1 tablet (10 mg total) by mouth daily. 30 tablet 4  . benazepril (LOTENSIN) 40 MG tablet Take 1 tablet (40 mg total) by mouth daily. 30 tablet 3  . calcium carbonate (OS-CAL) 600 MG TABS Take 600 mg by mouth daily.    . cholecalciferol (VITAMIN D) 1000 UNITS tablet Take 1,000 Units by mouth daily.    Marland Kitchen diltiazem (CARDIZEM CD) 300 MG 24 hr capsule Take 1 capsule (300 mg total) by mouth daily. 30 capsule 3  . docusate sodium (COLACE) 50 MG capsule Take by mouth as needed.      . hydrochlorothiazide (HYDRODIURIL) 25 MG tablet TAKE 1 TABLET DAILY 30 tablet 4  . levothyroxine (SYNTHROID, LEVOTHROID) 75 MCG tablet TAKE (1) TABLET DAILY BE- FORE BREAKFAST. 30 tablet 7  . metFORMIN (GLUCOPHAGE) 500 MG tablet TAKE  (1)  TABLET TWICE A DAY WITH MEALS (BREAKFAST AND SUPPER) 60 tablet 1  . metoprolol (LOPRESSOR) 50 MG tablet TAKE  (1)  TABLET TWICE A DAY. 60 tablet 4  . Multiple Vitamins-Minerals (CENTRUM  SILVER PO) Take by mouth daily. For Women    . raloxifene (EVISTA) 60 MG tablet Take 1 tablet (60 mg total) by mouth daily. 30 tablet 11  . XARELTO 20 MG TABS tablet TAKE 1 TABLET DAILY 30 tablet 2  . atorvastatin (LIPITOR) 10 MG tablet TAKE 1 TABLET DAILY (Patient not taking: Reported on 08/09/2014) 30 tablet 2  . benazepril (LOTENSIN) 40 MG tablet TAKE 1 TABLET DAILY (Patient not taking: Reported on 08/09/2014) 30 tablet 2  . diltiazem (CARDIZEM CD) 300 MG 24 hr capsule TAKE (1) CAPSULE DAILY (Patient not taking: Reported on 08/09/2014) 30 capsule 2  . fish oil-omega-3 fatty acids 1000 MG capsule Take 1 g by mouth daily.       No current facility-administered medications for this visit.    Review of Systems  : See HPI for pertinent positives and negatives.  Physical Examination  Filed Vitals:   08/09/14 1120 08/09/14 1129  BP: 99/65 118/60  Pulse: 64 64  Resp:  14  Height:  5\' 3"  (1.6 m)  Weight:  132 lb (59.875 kg)  SpO2:  97%   Body mass index is 23.39 kg/(m^2).   General: WDWN female in NAD GAIT: normal, using cane for the sight impaired Eyes: PERRLA Pulmonary: Non-labored, CTAB, Negative Rales, Negative rhonchi, & Negative wheezing.  Cardiac: irregular Rhythm   VASCULAR EXAM Carotid Bruits Right Left   positive positive   Aorta is not palpable. Radial pulses are 1+ right, 2+ left palpable.      LE Pulses Right Left   POPLITEAL not palpable  not palpable   POSTERIOR TIBIAL not palpable  not palpable    DORSALIS PEDIS  ANTERIOR TIBIAL faintly palpable  faintly palpable     Gastrointestinal: soft, nontender, BS WNL, no r/g, no masses palpated.  Musculoskeletal: age appropriate muscle atrophy/wasting. M/S 4/5 throughout, Extremities without ischemic changes.  Neurologic: A&O X 3; Appropriate Affect,  Speech is normal CN 2-12 intact, Pain and light touch intact in extremities, Motor exam as listed above.          Non-Invasive Vascular Imaging CAROTID DUPLEX 08/09/2014   CEREBROVASCULAR DUPLEX EVALUATION    INDICATION: Carotid artery disease    PREVIOUS INTERVENTION(S): None, Known right carotid and innominate artery stenosis    DUPLEX EXAM: Carotid duplex    RIGHT  LEFT  Peak Systolic Velocities (cm/s) End Diastolic Velocities (cm/s) Plaque LOCATION Peak Systolic Velocities (cm/s) End Diastolic Velocities (cm/s) Plaque  - - Occlud CCA PROXIMAL 59 9 HT  - - Occlud CCA MID 89 14 HT  20 retrograde 0 HT CCA DISTAL 73 15 HT  24 retrograde 0 CP ECA 78 6 HT  185 retrograde 48 CP ICA PROXIMAL  141 24 HT  62 4 0 ICA MID 112 13 CP  39 5 0 ICA DISTAL 119 17 -    N/A ICA / CCA Ratio (PSV) 1.5  Retrograde Vertebral Flow Antegrade  595 Brachial Systolic Pressure (mmHg) 638  Monophasic Brachial Artery Waveforms Triphasic    Plaque Morphology:  HM = Homogeneous, HT = Heterogeneous, CP = Calcific Plaque, SP = Smooth Plaque, IP = Irregular Plaque  ADDITIONAL FINDINGS:     IMPRESSION: 1. No color flow or Doppler signal noted in the proximal to mid right common carotid artery suggestive of occlusion/near occlusion  with  increased velocity noted in the internal carotid artery, may be fed by the external carotid artery. 2. Less than 40% left internal carotid artery stenosis. 3. Retrograde right vertebral  flow and monophasic waveform consistent with known diagnosis    Compared to the previous exam:  No significant change      Assessment: Kathy Robbins is a 81 y.o. female wh ois followed for known right carotid and innominate artery stenosis. She had radiation therapy to her throat in 2007 for throat cancer. She also had chemotherapy. She has no history of stroke ir TIA. Today's carotid Duplex reveals No color flow or Doppler signal noted in the proximal to mid right common carotid artery suggestive of occlusion/near occlusion  with  increased velocity noted in the internal carotid artery, may be fed by the external carotid artery. Less than 40% left internal carotid artery stenosis. The  ICA stenosis is  Unchanged from previous exam.  02/01/14 progress note from Dr. Oneida Alar: No indication for intervention as this is most likely a chronic occlusion of her right internal carotid and innominate artery. She has no right upper extremity symptoms. She has had no stroke symptoms. She has minimal stenosis on the left common carotid and left internal carotid. We will schedule her for a repeat carotid duplex scan in 6 months time. She'll return sooner she has any symptoms of TIA amaurosis or stroke.  She'll continue her Xarelto. The patient should have her blood pressure checks in her left arm as her right arm blood pressure checks will be and inaccurately low.  Plan: Based on today's Duplex results, HPI, and physical exam, and after discussing with Dr. Oneida Alar, patient advised to follow-up in 1 year with Carotid Duplex.   I discussed in depth with the patient the nature of atherosclerosis, and emphasized the importance of maximal medical management including strict control of blood pressure, blood glucose, and lipid levels, obtaining regular exercise, and continued cessation of smoking.  The patient is aware that without maximal medical management the underlying atherosclerotic disease process will progress, limiting the benefit of any interventions. The patient was given information about stroke prevention and what symptoms should prompt the patient to seek immediate medical care. Thank you for allowing [place] to participate in this patient's care.  Clemon Chambers, RN, MSN, FNP-C [facility] Office: [phone]  Clinic Physician: Oneida Alar  08/09/2014 11:31 AM

## 2014-08-09 NOTE — Patient Instructions (Signed)
Stroke Prevention Some medical conditions and behaviors are associated with an increased chance of having a stroke. You may prevent a stroke by making healthy choices and managing medical conditions. HOW CAN I REDUCE MY RISK OF HAVING A STROKE?   Stay physically active. Get at least 30 minutes of activity on most or all days.  Do not smoke. It may also be helpful to avoid exposure to secondhand smoke.  Limit alcohol use. Moderate alcohol use is considered to be:  No more than 2 drinks per day for men.  No more than 1 drink per day for nonpregnant women.  Eat healthy foods. This involves:  Eating 5 or more servings of fruits and vegetables a day.  Making dietary changes that address high blood pressure (hypertension), high cholesterol, diabetes, or obesity.  Manage your cholesterol levels.  Making food choices that are high in fiber and low in saturated fat, trans fat, and cholesterol may control cholesterol levels.  Take any prescribed medicines to control cholesterol as directed by your health care provider.  Manage your diabetes.  Controlling your carbohydrate and sugar intake is recommended to manage diabetes.  Take any prescribed medicines to control diabetes as directed by your health care provider.  Control your hypertension.  Making food choices that are low in salt (sodium), saturated fat, trans fat, and cholesterol is recommended to manage hypertension.  Take any prescribed medicines to control hypertension as directed by your health care provider.  Maintain a healthy weight.  Reducing calorie intake and making food choices that are low in sodium, saturated fat, trans fat, and cholesterol are recommended to manage weight.  Stop drug abuse.  Avoid taking birth control pills.  Talk to your health care provider about the risks of taking birth control pills if you are over 35 years old, smoke, get migraines, or have ever had a blood clot.  Get evaluated for sleep  disorders (sleep apnea).  Talk to your health care provider about getting a sleep evaluation if you snore a lot or have excessive sleepiness.  Take medicines only as directed by your health care provider.  For some people, aspirin or blood thinners (anticoagulants) are helpful in reducing the risk of forming abnormal blood clots that can lead to stroke. If you have the irregular heart rhythm of atrial fibrillation, you should be on a blood thinner unless there is a good reason you cannot take them.  Understand all your medicine instructions.  Make sure that other conditions (such as anemia or atherosclerosis) are addressed. SEEK IMMEDIATE MEDICAL CARE IF:   You have sudden weakness or numbness of the face, arm, or leg, especially on one side of the body.  Your face or eyelid droops to one side.  You have sudden confusion.  You have trouble speaking (aphasia) or understanding.  You have sudden trouble seeing in one or both eyes.  You have sudden trouble walking.  You have dizziness.  You have a loss of balance or coordination.  You have a sudden, severe headache with no known cause.  You have new chest pain or an irregular heartbeat. Any of these symptoms may represent a serious problem that is an emergency. Do not wait to see if the symptoms will go away. Get medical help at once. Call your local emergency services (911 in [place] Do not drive yourself to the hospital. Document Released: 07/09/2004 Document Revised: 10/16/2013 Document Reviewed: 12/02/2012 Exit[employer] Patient Information 2015 [employer]. This information is not intended to replace advice given   to you by your health care provider. Make sure you discuss any questions you have with your health care provider.  

## 2014-09-03 ENCOUNTER — Other Ambulatory Visit: Payer: Self-pay | Admitting: Family Medicine

## 2014-10-03 ENCOUNTER — Other Ambulatory Visit: Payer: Self-pay | Admitting: Family Medicine

## 2014-10-04 ENCOUNTER — Ambulatory Visit (INDEPENDENT_AMBULATORY_CARE_PROVIDER_SITE_OTHER): Payer: Medicare PPO | Admitting: Family Medicine

## 2014-10-04 ENCOUNTER — Encounter: Payer: Self-pay | Admitting: Family Medicine

## 2014-10-04 VITALS — BP 186/83 | HR 86 | Temp 97.1°F | Ht 63.0 in | Wt 131.0 lb

## 2014-10-04 DIAGNOSIS — E785 Hyperlipidemia, unspecified: Secondary | ICD-10-CM

## 2014-10-04 DIAGNOSIS — I4891 Unspecified atrial fibrillation: Secondary | ICD-10-CM | POA: Diagnosis not present

## 2014-10-04 DIAGNOSIS — E039 Hypothyroidism, unspecified: Secondary | ICD-10-CM | POA: Diagnosis not present

## 2014-10-04 DIAGNOSIS — I10 Essential (primary) hypertension: Secondary | ICD-10-CM

## 2014-10-04 DIAGNOSIS — Z23 Encounter for immunization: Secondary | ICD-10-CM

## 2014-10-04 DIAGNOSIS — E119 Type 2 diabetes mellitus without complications: Secondary | ICD-10-CM

## 2014-10-04 LAB — POCT GLYCOSYLATED HEMOGLOBIN (HGB A1C): Hemoglobin A1C: 6.4

## 2014-10-04 NOTE — Progress Notes (Signed)
Subjective:    Patient ID: Kathy Robbins, female    DOB: 1931-05-27, 81 y.o.   MRN: [id number]  HPI 81 year old female who is here to follow-up chronic problems including atrial fibrillation hyperlipidemia hypertension and hypothyroidism. She has no specific complaints today. She no longer sees cardiology for atrial fibrillation but she is expecting [place] to manage this problem. She denies any falls. She continues to use a broom handle for a cane. She denies any numbness or tingling in her feet or swelling.  Chief Complaint  Patient presents with  . Hypertension  . Hypothyroidism  . Hyperlipidemia  . Atrial Fibrillation  . Medical Management of Chronic Issues    3 month f/u    Patient Active Problem List   Diagnosis Date Noted  . Occlusion and stenosis of carotid artery without mention of cerebral infarction 01/15/2012  . Hypertension 05/12/2011  . Hypothyroidism 05/12/2011  . Hyperlipidemia 05/12/2011  . Type 2 diabetes mellitus 05/12/2011  . Atrial fibrillation  05/12/2011   Outpatient Encounter Prescriptions as of 10/04/2014  Medication Sig  . atorvastatin (LIPITOR) 10 MG tablet Take 1 tablet (10 mg total) by mouth daily.  . benazepril (LOTENSIN) 40 MG tablet Take 1 tablet (40 mg total) by mouth daily.  . calcium carbonate (OS-CAL) 600 MG TABS Take 600 mg by mouth daily.  . cholecalciferol (VITAMIN D) 1000 UNITS tablet Take 1,000 Units by mouth daily.  Marland Kitchen diltiazem (CARDIZEM CD) 300 MG 24 hr capsule Take 1 capsule (300 mg total) by mouth daily.  . fish oil-omega-3 fatty acids 1000 MG capsule Take 1 g by mouth daily.    . hydrochlorothiazide (HYDRODIURIL) 25 MG tablet TAKE 1 TABLET DAILY  . levothyroxine (SYNTHROID, LEVOTHROID) 75 MCG tablet TAKE (1) TABLET DAILY BE- FORE BREAKFAST.  . metFORMIN (GLUCOPHAGE) 500 MG tablet TAKE (1) TABLET TWICE A DAY WITH MEALS (BREAKFAST AND SUPPER)  . metoprolol (LOPRESSOR) 50 MG tablet TAKE  (1)  TABLET TWICE A DAY.  . Multiple  Vitamins-Minerals (CENTRUM SILVER PO) Take by mouth daily. For Women  . raloxifene (EVISTA) 60 MG tablet Take 1 tablet (60 mg total) by mouth daily.  Alveda Reasons 20 MG TABS tablet TAKE 1 TABLET DAILY  . [DISCONTINUED] atorvastatin (LIPITOR) 10 MG tablet TAKE 1 TABLET DAILY (Patient not taking: Reported on 08/09/2014)  . [DISCONTINUED] benazepril (LOTENSIN) 40 MG tablet TAKE 1 TABLET DAILY (Patient not taking: Reported on 08/09/2014)  . [DISCONTINUED] diltiazem (CARDIZEM CD) 300 MG 24 hr capsule TAKE (1) CAPSULE DAILY (Patient not taking: Reported on 08/09/2014)  . [DISCONTINUED] docusate sodium (COLACE) 50 MG capsule Take by mouth as needed.       Review of Systems  Constitutional: Negative.   Respiratory: Negative.   Cardiovascular: Negative.   Genitourinary: Negative.   Neurological: Negative.   Psychiatric/Behavioral: Negative.        Objective:   Physical Exam  Constitutional: She is oriented to person, place, and time. She appears well-developed and well-nourished.  Neck: Normal range of motion.  Cardiovascular:  Rhythm is irregularly irregular consistent with atrial fibrillation  Pulmonary/Chest: Effort normal and breath sounds normal.  Abdominal: Soft.  Neurological: She is alert and oriented to person, place, and time.    BP 186/83 mmHg  Pulse 86  Temp(Src) 97.1 F (36.2 C) (Oral)  Ht 5\' 3"  (1.6 m)  Wt 131 lb (59.421 kg)  BMI 23.21 kg/m2       Assessment & Plan:  1. Type 2 diabetes mellitus without complication Patient continues  with metformin. She does not monitor sugars at home. Last A1c was well within her goals for her. - POCT glycosylated hemoglobin (Hb A1C)  2. Hypothyroidism, unspecified hypothyroidism type No symptoms suggestive of hypothyroidism  3. Essential hypertension Blood pressure is not well controlled today. At her last visit was normal but she did not take her medicines this morning which include benazepril hydrochlorthiazide metoprolol.  4.  Hyperlipidemia Denies any myalgias or other symptoms related to statin use   5. Atrial fibrillation  She remains in fibrillation but rate is controlled and she is anticoagulated. She is asymptomatic. Treatment should remain the same  Wardell Honour MD

## 2014-10-04 NOTE — Telephone Encounter (Signed)
Last seen 05/17/14 Dr Sabra Heck  Last lipid 02/12/14

## 2014-10-04 NOTE — Patient Instructions (Signed)

## 2014-10-08 NOTE — Telephone Encounter (Signed)
Pt aware refills sent to pharmacy 

## 2014-11-05 ENCOUNTER — Other Ambulatory Visit: Payer: Self-pay | Admitting: Family Medicine

## 2014-12-05 ENCOUNTER — Other Ambulatory Visit: Payer: Self-pay | Admitting: Family Medicine

## 2014-12-05 ENCOUNTER — Other Ambulatory Visit: Payer: Self-pay | Admitting: Family

## 2014-12-28 ENCOUNTER — Encounter: Payer: Self-pay | Admitting: Family Medicine

## 2015-01-07 ENCOUNTER — Other Ambulatory Visit: Payer: Self-pay | Admitting: Family Medicine

## 2015-01-30 ENCOUNTER — Other Ambulatory Visit: Payer: Self-pay | Admitting: Family Medicine

## 2015-01-30 NOTE — Telephone Encounter (Signed)
Last seen 10/04/14  Dr Sabra Heck  Last thyroid 02/12/14

## 2015-02-05 ENCOUNTER — Ambulatory Visit (INDEPENDENT_AMBULATORY_CARE_PROVIDER_SITE_OTHER): Payer: Medicare PPO | Admitting: Family Medicine

## 2015-02-05 ENCOUNTER — Encounter: Payer: Self-pay | Admitting: Family Medicine

## 2015-02-05 VITALS — Temp 97.6°F | Ht 63.0 in | Wt 131.2 lb

## 2015-02-05 DIAGNOSIS — E785 Hyperlipidemia, unspecified: Secondary | ICD-10-CM | POA: Diagnosis not present

## 2015-02-05 DIAGNOSIS — E039 Hypothyroidism, unspecified: Secondary | ICD-10-CM | POA: Diagnosis not present

## 2015-02-05 DIAGNOSIS — E119 Type 2 diabetes mellitus without complications: Secondary | ICD-10-CM

## 2015-02-05 DIAGNOSIS — I10 Essential (primary) hypertension: Secondary | ICD-10-CM

## 2015-02-05 LAB — POCT GLYCOSYLATED HEMOGLOBIN (HGB A1C): Hemoglobin A1C: 6.3

## 2015-02-05 NOTE — Progress Notes (Signed)
Subjective:    Patient ID: Kathy Robbins, female    DOB: 1930/07/15, 81 y.o.   MRN: [id number]  HPI 81-year-old female whose here to follow-up her hypertension height, hyperlipidemia, type 2 diabetes, atrial fibrillation,. Generally, she is doing well with no complaints today. She lives alone and does her own cooking and cleaning but does not see well enough to drive and depends on others for transportation. Diabetes has been well controlled in the past with last A1c at 6.4. She takes Xarelto for her atrial fibrillation and rate is controlled with beta blocker and calcium channel blocker.    Review of Systems  Constitutional: Negative.  Negative for unexpected weight change.  HENT: Negative.   Respiratory: Negative.   Cardiovascular: Negative.   Genitourinary: Negative.   Neurological: Negative.   Psychiatric/Behavioral: Negative.        Objective:   Physical Exam  Constitutional: She is oriented to person, place, and time. She appears well-developed and well-nourished.  Neck:  History of carotid artery disease and has a bruit appreciated on the left  Cardiovascular: Normal rate and normal heart sounds.   Pulmonary/Chest: Effort normal and breath sounds normal.  Neurological: She is alert and oriented to person, place, and time.  Psychiatric: She has a normal mood and affect. Her behavior is normal. Thought content normal.          Assessment & Plan:  1. Essential hypertension Blood pressure is typically elevated but patient does not take her medicine before she comes as she anticipates blood work. I told her it's okay to take medicine with water but she does not. We'll continue home with rate controlling medicines as well as blood pressure meds - CMP14+EGFR  2. Hypothyroidism, unspecified hypothyroidism type Last TSH was in the therapeutic range one year ago we'll repeat - TSH  3. Type 2 diabetes mellitus without complication Diabetes does not seem to be much of a  problem fortunately since she eats what she wants. - POCT glycosylated hemoglobin (Hb A1C)  4. Hyperlipidemia Continues on low-dose atorvastatin which I think is probably reasonable given her carotid disease and diabetes. She denies any myalgias or other side effects  Wardell Honour MD - Lipid panel  Patient Active Problem List   Diagnosis Date Noted  . Occlusion and stenosis of carotid artery without mention of cerebral infarction 01/15/2012  . Hypertension 05/12/2011  . Hypothyroidism 05/12/2011  . Hyperlipidemia 05/12/2011  . Type 2 diabetes mellitus 05/12/2011  . Atrial fibrillation  05/12/2011   Outpatient Encounter Prescriptions as of 02/05/2015  Medication Sig  . atorvastatin (LIPITOR) 10 MG tablet Take 1 tablet (10 mg total) by mouth daily.  . benazepril (LOTENSIN) 40 MG tablet TAKE 1 TABLET DAILY  . calcium carbonate (OS-CAL) 600 MG TABS Take 600 mg by mouth daily.  . cholecalciferol (VITAMIN D) 1000 UNITS tablet Take 1,000 Units by mouth daily.  Marland Kitchen diltiazem (CARDIZEM CD) 300 MG 24 hr capsule Take 1 capsule (300 mg total) by mouth daily.  . fish oil-omega-3 fatty acids 1000 MG capsule Take 1 g by mouth daily.    . hydrochlorothiazide (HYDRODIURIL) 25 MG tablet TAKE 1 TABLET DAILY  . levothyroxine (SYNTHROID, LEVOTHROID) 75 MCG tablet TAKE (1) TABLET DAILY BE- FORE BREAKFAST.  . metFORMIN (GLUCOPHAGE) 500 MG tablet TAKE (1) TABLET TWICE A DAY WITH MEALS (BREAKFAST AND SUPPER)  . metoprolol (LOPRESSOR) 50 MG tablet TAKE (1) TABLET TWICE A DAY.  . Multiple Vitamins-Minerals (CENTRUM SILVER PO) Take by mouth daily.  For Women  . raloxifene (EVISTA) 60 MG tablet TAKE 1 TABLET DAILY FOR BONES  . XARELTO 20 MG TABS tablet TAKE 1 TABLET DAILY  . [DISCONTINUED] benazepril (LOTENSIN) 40 MG tablet Take 1 tablet (40 mg total) by mouth daily.  . [DISCONTINUED] atorvastatin (LIPITOR) 10 MG tablet TAKE 1 TABLET DAILY  . [DISCONTINUED] diltiazem (CARDIZEM CD) 300 MG 24 hr capsule TAKE (1)  CAPSULE DAILY   No facility-administered encounter medications on file as of 02/05/2015.

## 2015-02-06 LAB — LIPID PANEL
CHOL/HDL RATIO: 2.4 ratio (ref 0.0–4.4)
Cholesterol, Total: 106 mg/dL (ref 100–199)
HDL: 45 mg/dL (ref >39)
LDL Calculated: 44 mg/dL (ref 0–99)
Triglycerides: 86 mg/dL (ref 0–149)
VLDL Cholesterol Cal: 17 mg/dL (ref 5–40)

## 2015-02-06 LAB — CMP14+EGFR
ALT: 7 IU/L (ref 0–32)
AST: 14 IU/L (ref 0–40)
Albumin/Globulin Ratio: 1.5 (ref 1.1–2.5)
Albumin: 3.7 g/dL (ref 3.5–4.7)
Alkaline Phosphatase: 76 IU/L (ref 39–117)
BUN/Creatinine Ratio: 25 (ref 11–26)
BUN: 23 mg/dL (ref 8–27)
Bilirubin Total: 0.8 mg/dL (ref 0.0–1.2)
CHLORIDE: 95 mmol/L — AB (ref 97–108)
CO2: 24 mmol/L (ref 18–29)
CREATININE: 0.93 mg/dL (ref 0.57–1.00)
Calcium: 9 mg/dL (ref 8.7–10.3)
GFR, EST AFRICAN AMERICAN: 67 mL/min/{1.73_m2} (ref >59)
GFR, EST NON AFRICAN AMERICAN: 58 mL/min/{1.73_m2} — AB (ref >59)
GLUCOSE: 117 mg/dL — AB (ref 65–99)
Globulin, Total: 2.5 g/dL (ref 1.5–4.5)
Potassium: 3.5 mmol/L (ref 3.5–5.2)
Sodium: 138 mmol/L (ref 134–144)
Total Protein: 6.2 g/dL (ref 6.0–8.5)

## 2015-02-06 LAB — TSH: TSH: 0.058 u[IU]/mL — ABNORMAL LOW (ref 0.450–4.500)

## 2015-02-06 MED ORDER — LEVOTHYROXINE SODIUM 50 MCG PO TABS
50.0000 ug | ORAL_TABLET | Freq: Every day | ORAL | Status: DC
Start: 2015-02-06 — End: 2015-06-28

## 2015-02-06 NOTE — Addendum Note (Signed)
Addended by: Thana Ates on: 02/06/2015 10:46 AM   Modules accepted: Orders

## 2015-03-07 ENCOUNTER — Other Ambulatory Visit: Payer: Self-pay | Admitting: Family Medicine

## 2015-04-03 ENCOUNTER — Ambulatory Visit (INDEPENDENT_AMBULATORY_CARE_PROVIDER_SITE_OTHER): Payer: Medicare PPO

## 2015-04-03 VITALS — BP 139/59

## 2015-04-03 DIAGNOSIS — I10 Essential (primary) hypertension: Secondary | ICD-10-CM

## 2015-04-03 NOTE — Progress Notes (Signed)
Patient ID: Kathy Robbins, female   DOB: [birth year], 81 y.o.   MRN: [id number]   Patient returned to office today for a follow up of BP. Patient has a history of hypertension and it has been elevated in the past. Today is was 139/59.

## 2015-04-05 ENCOUNTER — Other Ambulatory Visit: Payer: Self-pay | Admitting: Family Medicine

## 2015-06-03 ENCOUNTER — Other Ambulatory Visit: Payer: Self-pay | Admitting: Family Medicine

## 2015-06-12 ENCOUNTER — Ambulatory Visit: Payer: Medicare PPO | Admitting: Family Medicine

## 2015-06-28 ENCOUNTER — Inpatient Hospital Stay (HOSPITAL_COMMUNITY)
Admission: EM | Admit: 2015-06-28 | Discharge: 2015-07-17 | DRG: 070 | Disposition: E | Payer: Medicare Other | Attending: Family Medicine | Admitting: Family Medicine

## 2015-06-28 ENCOUNTER — Emergency Department (HOSPITAL_COMMUNITY): Payer: Medicare Other

## 2015-06-28 ENCOUNTER — Encounter (HOSPITAL_COMMUNITY): Payer: Self-pay | Admitting: Emergency Medicine

## 2015-06-28 DIAGNOSIS — Z515 Encounter for palliative care: Secondary | ICD-10-CM | POA: Diagnosis not present

## 2015-06-28 DIAGNOSIS — E46 Unspecified protein-calorie malnutrition: Secondary | ICD-10-CM | POA: Diagnosis present

## 2015-06-28 DIAGNOSIS — Z66 Do not resuscitate: Secondary | ICD-10-CM | POA: Diagnosis not present

## 2015-06-28 DIAGNOSIS — I5043 Acute on chronic combined systolic (congestive) and diastolic (congestive) heart failure: Secondary | ICD-10-CM | POA: Diagnosis present

## 2015-06-28 DIAGNOSIS — E039 Hypothyroidism, unspecified: Secondary | ICD-10-CM | POA: Diagnosis present

## 2015-06-28 DIAGNOSIS — I5031 Acute diastolic (congestive) heart failure: Secondary | ICD-10-CM | POA: Diagnosis present

## 2015-06-28 DIAGNOSIS — F03918 Unspecified dementia, unspecified severity, with other behavioral disturbance: Secondary | ICD-10-CM | POA: Diagnosis present

## 2015-06-28 DIAGNOSIS — M1990 Unspecified osteoarthritis, unspecified site: Secondary | ICD-10-CM | POA: Diagnosis present

## 2015-06-28 DIAGNOSIS — Z87891 Personal history of nicotine dependence: Secondary | ICD-10-CM

## 2015-06-28 DIAGNOSIS — Z85038 Personal history of other malignant neoplasm of large intestine: Secondary | ICD-10-CM | POA: Diagnosis not present

## 2015-06-28 DIAGNOSIS — J690 Pneumonitis due to inhalation of food and vomit: Secondary | ICD-10-CM | POA: Diagnosis not present

## 2015-06-28 DIAGNOSIS — R1314 Dysphagia, pharyngoesophageal phase: Secondary | ICD-10-CM | POA: Diagnosis present

## 2015-06-28 DIAGNOSIS — R001 Bradycardia, unspecified: Secondary | ICD-10-CM | POA: Diagnosis present

## 2015-06-28 DIAGNOSIS — Z7984 Long term (current) use of oral hypoglycemic drugs: Secondary | ICD-10-CM

## 2015-06-28 DIAGNOSIS — N179 Acute kidney failure, unspecified: Secondary | ICD-10-CM | POA: Diagnosis present

## 2015-06-28 DIAGNOSIS — Z823 Family history of stroke: Secondary | ICD-10-CM | POA: Diagnosis not present

## 2015-06-28 DIAGNOSIS — R0902 Hypoxemia: Secondary | ICD-10-CM

## 2015-06-28 DIAGNOSIS — J9601 Acute respiratory failure with hypoxia: Secondary | ICD-10-CM | POA: Diagnosis present

## 2015-06-28 DIAGNOSIS — F1021 Alcohol dependence, in remission: Secondary | ICD-10-CM | POA: Insufficient documentation

## 2015-06-28 DIAGNOSIS — E119 Type 2 diabetes mellitus without complications: Secondary | ICD-10-CM

## 2015-06-28 DIAGNOSIS — E11649 Type 2 diabetes mellitus with hypoglycemia without coma: Secondary | ICD-10-CM | POA: Diagnosis present

## 2015-06-28 DIAGNOSIS — I10 Essential (primary) hypertension: Secondary | ICD-10-CM | POA: Diagnosis present

## 2015-06-28 DIAGNOSIS — R627 Adult failure to thrive: Secondary | ICD-10-CM | POA: Diagnosis present

## 2015-06-28 DIAGNOSIS — I4891 Unspecified atrial fibrillation: Secondary | ICD-10-CM | POA: Diagnosis not present

## 2015-06-28 DIAGNOSIS — E785 Hyperlipidemia, unspecified: Secondary | ICD-10-CM | POA: Diagnosis present

## 2015-06-28 DIAGNOSIS — R4701 Aphasia: Secondary | ICD-10-CM | POA: Insufficient documentation

## 2015-06-28 DIAGNOSIS — T7401XA Adult neglect or abandonment, confirmed, initial encounter: Secondary | ICD-10-CM | POA: Diagnosis present

## 2015-06-28 DIAGNOSIS — G9340 Encephalopathy, unspecified: Principal | ICD-10-CM | POA: Diagnosis present

## 2015-06-28 DIAGNOSIS — I6529 Occlusion and stenosis of unspecified carotid artery: Secondary | ICD-10-CM | POA: Diagnosis present

## 2015-06-28 DIAGNOSIS — Z85819 Personal history of malignant neoplasm of unspecified site of lip, oral cavity, and pharynx: Secondary | ICD-10-CM

## 2015-06-28 DIAGNOSIS — E860 Dehydration: Secondary | ICD-10-CM | POA: Diagnosis present

## 2015-06-28 DIAGNOSIS — Z809 Family history of malignant neoplasm, unspecified: Secondary | ICD-10-CM

## 2015-06-28 DIAGNOSIS — E870 Hyperosmolality and hypernatremia: Secondary | ICD-10-CM | POA: Diagnosis not present

## 2015-06-28 DIAGNOSIS — I482 Chronic atrial fibrillation, unspecified: Secondary | ICD-10-CM | POA: Insufficient documentation

## 2015-06-28 DIAGNOSIS — E1100 Type 2 diabetes mellitus with hyperosmolarity without nonketotic hyperglycemic-hyperosmolar coma (NKHHC): Secondary | ICD-10-CM

## 2015-06-28 DIAGNOSIS — Z7901 Long term (current) use of anticoagulants: Secondary | ICD-10-CM | POA: Diagnosis not present

## 2015-06-28 DIAGNOSIS — F0391 Unspecified dementia with behavioral disturbance: Secondary | ICD-10-CM | POA: Diagnosis present

## 2015-06-28 DIAGNOSIS — I110 Hypertensive heart disease with heart failure: Secondary | ICD-10-CM | POA: Diagnosis present

## 2015-06-28 DIAGNOSIS — R410 Disorientation, unspecified: Secondary | ICD-10-CM

## 2015-06-28 DIAGNOSIS — E876 Hypokalemia: Secondary | ICD-10-CM | POA: Diagnosis present

## 2015-06-28 DIAGNOSIS — A419 Sepsis, unspecified organism: Secondary | ICD-10-CM | POA: Diagnosis not present

## 2015-06-28 DIAGNOSIS — M8580 Other specified disorders of bone density and structure, unspecified site: Secondary | ICD-10-CM | POA: Diagnosis present

## 2015-06-28 DIAGNOSIS — R4182 Altered mental status, unspecified: Secondary | ICD-10-CM | POA: Diagnosis present

## 2015-06-28 DIAGNOSIS — I959 Hypotension, unspecified: Secondary | ICD-10-CM | POA: Diagnosis present

## 2015-06-28 DIAGNOSIS — I509 Heart failure, unspecified: Secondary | ICD-10-CM | POA: Diagnosis not present

## 2015-06-28 DIAGNOSIS — D509 Iron deficiency anemia, unspecified: Secondary | ICD-10-CM | POA: Diagnosis present

## 2015-06-28 LAB — URINE MICROSCOPIC-ADD ON
Bacteria, UA: NONE SEEN
RBC / HPF: NONE SEEN RBC/hpf (ref 0–5)

## 2015-06-28 LAB — CBC WITH DIFFERENTIAL/PLATELET
Basophils Absolute: 0 10*3/uL (ref 0.0–0.1)
Basophils Relative: 1 %
Eosinophils Absolute: 0.1 10*3/uL (ref 0.0–0.7)
Eosinophils Relative: 1 %
HEMATOCRIT: 30.4 % — AB (ref 36.0–46.0)
HEMOGLOBIN: 8.8 g/dL — AB (ref 12.0–15.0)
LYMPHS ABS: 0.9 10*3/uL (ref 0.7–4.0)
LYMPHS PCT: 14 %
MCH: 22 pg — AB (ref 26.0–34.0)
MCHC: 28.9 g/dL — ABNORMAL LOW (ref 30.0–36.0)
MCV: 76 fL — AB (ref 78.0–100.0)
MONOS PCT: 9 %
Monocytes Absolute: 0.6 10*3/uL (ref 0.1–1.0)
NEUTROS ABS: 4.7 10*3/uL (ref 1.7–7.7)
NEUTROS PCT: 75 %
Platelets: 342 10*3/uL (ref 150–400)
RBC: 4 MIL/uL (ref 3.87–5.11)
RDW: 24.5 % — ABNORMAL HIGH (ref 11.5–15.5)
WBC: 6.3 10*3/uL (ref 4.0–10.5)

## 2015-06-28 LAB — RAPID URINE DRUG SCREEN, HOSP PERFORMED
Amphetamines: NOT DETECTED
Barbiturates: NOT DETECTED
Benzodiazepines: NOT DETECTED
Cocaine: NOT DETECTED
OPIATES: NOT DETECTED
Tetrahydrocannabinol: NOT DETECTED

## 2015-06-28 LAB — TROPONIN I: Troponin I: 0.03 ng/mL (ref <0.031)

## 2015-06-28 LAB — URINALYSIS, ROUTINE W REFLEX MICROSCOPIC
Bilirubin Urine: NEGATIVE
Glucose, UA: NEGATIVE mg/dL
Hgb urine dipstick: NEGATIVE
Ketones, ur: NEGATIVE mg/dL
Leukocytes, UA: NEGATIVE
NITRITE: NEGATIVE
PH: 6 (ref 5.0–8.0)
SPECIFIC GRAVITY, URINE: 1.025 (ref 1.005–1.030)

## 2015-06-28 LAB — COMPREHENSIVE METABOLIC PANEL
ALT: 10 U/L — ABNORMAL LOW (ref 14–54)
AST: 19 U/L (ref 15–41)
Albumin: 2.7 g/dL — ABNORMAL LOW (ref 3.5–5.0)
Alkaline Phosphatase: 92 U/L (ref 38–126)
Anion gap: 11 (ref 5–15)
BUN: 13 mg/dL (ref 6–20)
CO2: 28 mmol/L (ref 22–32)
Calcium: 8.3 mg/dL — ABNORMAL LOW (ref 8.9–10.3)
Chloride: 105 mmol/L (ref 101–111)
Creatinine, Ser: 1.12 mg/dL — ABNORMAL HIGH (ref 0.44–1.00)
GFR calc Af Amer: 52 mL/min — ABNORMAL LOW (ref >60)
GFR calc non Af Amer: 44 mL/min — ABNORMAL LOW (ref >60)
Glucose, Bld: 118 mg/dL — ABNORMAL HIGH (ref 65–99)
Potassium: 3.1 mmol/L — ABNORMAL LOW (ref 3.5–5.1)
Sodium: 144 mmol/L (ref 135–145)
Total Bilirubin: 0.6 mg/dL (ref 0.3–1.2)
Total Protein: 6.1 g/dL — ABNORMAL LOW (ref 6.5–8.1)

## 2015-06-28 LAB — AMMONIA: Ammonia: 9 umol/L (ref 9–35)

## 2015-06-28 LAB — CK: Total CK: 27 U/L — ABNORMAL LOW (ref 38–234)

## 2015-06-28 LAB — ETHANOL: Alcohol, Ethyl (B): <5 mg/dL (ref <5)

## 2015-06-28 LAB — LACTIC ACID, PLASMA: LACTIC ACID, VENOUS: 1.9 mmol/L (ref 0.5–2.0)

## 2015-06-28 LAB — TSH: TSH: 11.721 u[IU]/mL — AB (ref 0.350–4.500)

## 2015-06-28 LAB — MAGNESIUM: Magnesium: 1.3 mg/dL — ABNORMAL LOW (ref 1.7–2.4)

## 2015-06-28 LAB — BRAIN NATRIURETIC PEPTIDE: B Natriuretic Peptide: 2279 pg/mL — ABNORMAL HIGH (ref 0.0–100.0)

## 2015-06-28 LAB — CBG MONITORING, ED: GLUCOSE-CAPILLARY: 99 mg/dL (ref 65–99)

## 2015-06-28 MED ORDER — RIVAROXABAN 20 MG PO TABS
20.0000 mg | ORAL_TABLET | Freq: Every day | ORAL | Status: DC
  Filled 2015-06-28: qty 1

## 2015-06-28 MED ORDER — ONDANSETRON HCL 4 MG/2ML IJ SOLN: 4.0000 mg | Freq: Four times a day (QID) | INTRAMUSCULAR | Status: DC | PRN

## 2015-06-28 MED ORDER — POTASSIUM CHLORIDE IN NACL 40-0.9 MEQ/L-% IV SOLN
INTRAVENOUS | Status: DC
  Administered 2015-06-29: 100 mL/h via INTRAVENOUS
  Filled 2015-06-28 (×2): qty 1000

## 2015-06-28 MED ORDER — SENNOSIDES-DOCUSATE SODIUM 8.6-50 MG PO TABS: 1.0000 | ORAL_TABLET | Freq: Every evening | ORAL | Status: DC | PRN

## 2015-06-28 MED ORDER — POTASSIUM CHLORIDE CRYS ER 20 MEQ PO TBCR
20.0000 meq | EXTENDED_RELEASE_TABLET | Freq: Two times a day (BID) | ORAL | Status: DC
  Administered 2015-06-29: 20 meq via ORAL
  Filled 2015-06-28 (×2): qty 1

## 2015-06-28 MED ORDER — VITAMIN D 1000 UNITS PO TABS
1000.0000 [IU] | ORAL_TABLET | Freq: Every day | ORAL | Status: DC
  Filled 2015-06-28 (×4): qty 1

## 2015-06-28 MED ORDER — MAGNESIUM SULFATE 50 % IJ SOLN
1.0000 g | Freq: Once | INTRAMUSCULAR | Status: DC
  Filled 2015-06-28: qty 2

## 2015-06-28 MED ORDER — SODIUM CHLORIDE 0.9 % IJ SOLN
3.0000 mL | Freq: Two times a day (BID) | INTRAMUSCULAR | Status: DC
  Administered 2015-06-29 – 2015-07-02 (×7): 3 mL via INTRAVENOUS

## 2015-06-28 MED ORDER — CALCIUM CARBONATE 1250 (500 CA) MG PO TABS
1.0000 | ORAL_TABLET | Freq: Every day | ORAL | Status: DC
  Filled 2015-06-28 (×3): qty 1

## 2015-06-28 MED ORDER — BENAZEPRIL HCL 5 MG PO TABS
5.0000 mg | ORAL_TABLET | Freq: Every day | ORAL | Status: DC
  Filled 2015-06-28: qty 1

## 2015-06-28 MED ORDER — ACETAMINOPHEN 325 MG PO TABS: 650.0000 mg | ORAL_TABLET | Freq: Four times a day (QID) | ORAL | Status: DC | PRN

## 2015-06-28 MED ORDER — HYDROCODONE-ACETAMINOPHEN 5-325 MG PO TABS: 1.0000 | ORAL_TABLET | ORAL | Status: DC | PRN

## 2015-06-28 MED ORDER — FLEET ENEMA 7-19 GM/118ML RE ENEM: 1.0000 | ENEMA | Freq: Once | RECTAL | Status: DC | PRN

## 2015-06-28 MED ORDER — RALOXIFENE HCL 60 MG PO TABS
60.0000 mg | ORAL_TABLET | Freq: Every day | ORAL | Status: DC
  Filled 2015-06-28: qty 1

## 2015-06-28 MED ORDER — CALCIUM CARBONATE 600 MG PO TABS: 600.0000 mg | ORAL_TABLET | Freq: Every day | ORAL | Status: DC

## 2015-06-28 MED ORDER — BISACODYL 5 MG PO TBEC: 5.0000 mg | DELAYED_RELEASE_TABLET | Freq: Every day | ORAL | Status: DC | PRN

## 2015-06-28 MED ORDER — ONDANSETRON HCL 4 MG PO TABS: 4.0000 mg | ORAL_TABLET | Freq: Four times a day (QID) | ORAL | Status: DC | PRN

## 2015-06-28 MED ORDER — METOPROLOL TARTRATE 25 MG PO TABS
25.0000 mg | ORAL_TABLET | Freq: Two times a day (BID) | ORAL | Status: DC
  Administered 2015-06-29: 25 mg via ORAL
  Filled 2015-06-28 (×2): qty 1

## 2015-06-28 MED ORDER — OMEGA-3 FATTY ACIDS 1000 MG PO CAPS: 1.0000 g | ORAL_CAPSULE | Freq: Every day | ORAL | Status: DC

## 2015-06-28 MED ORDER — LEVOTHYROXINE SODIUM 75 MCG PO TABS
75.0000 ug | ORAL_TABLET | Freq: Every day | ORAL | Status: DC
  Filled 2015-06-28: qty 1

## 2015-06-28 MED ORDER — OMEGA-3-ACID ETHYL ESTERS 1 G PO CAPS
1.0000 g | ORAL_CAPSULE | Freq: Every day | ORAL | Status: DC
  Filled 2015-06-28: qty 1

## 2015-06-28 MED ORDER — ATORVASTATIN CALCIUM 10 MG PO TABS
10.0000 mg | ORAL_TABLET | Freq: Every day | ORAL | Status: DC
  Filled 2015-06-28: qty 1

## 2015-06-28 MED ORDER — DILTIAZEM HCL 60 MG PO TABS
60.0000 mg | ORAL_TABLET | Freq: Three times a day (TID) | ORAL | Status: DC
  Administered 2015-06-29 (×2): 60 mg via ORAL
  Filled 2015-06-28 (×2): qty 1

## 2015-06-28 MED ORDER — ACETAMINOPHEN 650 MG RE SUPP: 650.0000 mg | Freq: Four times a day (QID) | RECTAL | Status: DC | PRN

## 2015-06-28 MED ORDER — SODIUM CHLORIDE 0.9 % IV SOLN
Freq: Once | INTRAVENOUS | Status: AC
  Administered 2015-06-28: 100 mL/h via INTRAVENOUS

## 2015-06-28 MED ORDER — CENTRUM SILVER PO TABS: 1.0000 | ORAL_TABLET | Freq: Every day | ORAL | Status: DC

## 2015-06-28 MED ORDER — INSULIN ASPART 100 UNIT/ML ~~LOC~~ SOLN
0.0000 [IU] | Freq: Three times a day (TID) | SUBCUTANEOUS | Status: DC
Start: 2015-06-29 — End: 2015-07-03
  Administered 2015-06-29: 1 [IU] via SUBCUTANEOUS

## 2015-06-28 MED ORDER — ADULT MULTIVITAMIN W/MINERALS CH
1.0000 | ORAL_TABLET | Freq: Every day | ORAL | Status: DC
Start: 2015-06-29 — End: 2015-06-29
  Filled 2015-06-28: qty 1

## 2015-06-28 NOTE — ED Notes (Signed)
Patient arrives by [facility] with IVC. IVC states that patient is neglecting herself medically as well as living in unsanitary conditions at home that include roach infestation, rotting food. Patient is eating the rotting food per IVC. Patient was at a hospital yesterday for fall but left without receiving treatment. Patient is alert, but does not answer questions except for saying "never, never, never."

## 2015-06-28 NOTE — ED Notes (Signed)
assisted MD with hemoccult, Neg result

## 2015-06-28 NOTE — ED Notes (Signed)
Pt continue to not answer questions appropriately, calm, no complaints

## 2015-06-28 NOTE — ED Provider Notes (Signed)
CSN: [id number]     Arrival date & time 07/15/2015  1648 History   First MD Initiated Contact with Patient 07/07/2015 1713     Chief Complaint  Patient presents with  . V70.1      HPI  Patient presents for evaluation via accompanied by [facility] Department on IVC.  IVC was written by a Education officer, museum followed up with the patient at home today after she was a patient last night at [facility] emergency room. Apparently, was seen there for a fall. The triage assessment there stated that she fell in the living room head or initially refused EMS was confused and her heart rate increased to 150 and was unable to answer questions. She "became more alert once in the truck".  History is obtained from the  accompanying police officer, and the patient's chart.  Patient lives alone. He was able to make it to her outpatient appointments at [facility] family practice as recently as October 19 per the chart.  Apparently today, social worker went to the patient's house. Patient was weak and unable to walk. Her house was described as "roach infested with rotting food".   Patient upon arrival is only able to say 1 or 2 words and perseverates. No documented history of delirium or dementia in the patient's chart.  Past Medical History  Diagnosis Date  . HTN (hypertension)   . History of colon cancer   . Atrial fibrillation or flutter   . Hyperlipidemia   . Thyroid disease     hypothyroidism  . Diabetes mellitus     NIDDM  . Osteopenia   . Arthritis   . Cancer [facility]) July 2013    Throat   Past Surgical History  Procedure Laterality Date  . 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 Lymph node biopsy      removal  . Colonoscopy  Jan. 2013    6 polyps  removed   Family History  Problem Relation Age of Onset  . Cancer Mother     "female organs"  . Stroke Mother 43   Social History  Substance Use Topics  . Smoking status: Former Smoker    Quit date:  06/16/1963  . Smokeless tobacco: Never Used     Comment: quit in 1995  . Alcohol Use: No   OB History    No data available     Review of Systems  Constitutional: Negative for fever, chills, diaphoresis, appetite change and fatigue.  HENT: Negative for mouth sores, sore throat and trouble swallowing.   Eyes: Negative for visual disturbance.  Respiratory: Negative for cough, chest tightness, shortness of breath and wheezing.   Cardiovascular: Negative for chest pain.  Gastrointestinal: Negative for nausea, vomiting, abdominal pain, diarrhea and abdominal distention.  Endocrine: Negative for polydipsia, polyphagia and polyuria.  Genitourinary: Negative for dysuria, frequency and hematuria.  Musculoskeletal: Negative for gait problem.  Skin: Negative for color change, pallor and rash.  Neurological: Negative for dizziness, syncope, light-headedness and headaches.  Hematological: Does not bruise/bleed easily.  Psychiatric/Behavioral: Negative for behavioral problems and confusion.      Allergies  Iohexol  Home Medications   Prior to Admission medications   Medication Sig Start Date End Date Taking? Authorizing Provider  atorvastatin (LIPITOR) 10 MG tablet TAKE 1 TABLET DAILY 04/05/15  Yes Wardell Honour, MD  benazepril (LOTENSIN) 40 MG tablet TAKE 1 TABLET DAILY 04/05/15  Yes Wardell Honour, MD  calcium carbonate (OS-CAL) 600 MG TABS Take 600 mg by mouth daily.   Yes Historical Provider, MD  cholecalciferol (VITAMIN D) 1000 UNITS tablet Take 1,000 Units by mouth daily.   Yes Historical Provider, MD  diltiazem (CARDIZEM CD) 300 MG 24 hr capsule TAKE (1) CAPSULE DAILY 06/04/15  Yes Wardell Honour, MD  fish oil-omega-3 fatty acids 1000 MG capsule Take 1 g by mouth daily.     Yes Historical Provider, MD  hydrochlorothiazide (HYDRODIURIL) 25 MG tablet TAKE 1 TABLET DAILY 04/05/15  Yes Wardell Honour, MD  levothyroxine (SYNTHROID, LEVOTHROID) 75 MCG tablet TAKE (1) TABLET DAILY  BE- FORE BREAKFAST. 03/07/15  Yes Wardell Honour, MD  metFORMIN (GLUCOPHAGE) 500 MG tablet TAKE (1) TABLET TWICE A DAY WITH MEALS (BREAKFAST AND SUPPER) 04/05/15  Yes Wardell Honour, MD  metoprolol (LOPRESSOR) 50 MG tablet TAKE (1) TABLET TWICE A DAY. 04/05/15  Yes Wardell Honour, MD  Multiple Vitamins-Minerals (CENTRUM SILVER PO) Take 1 tablet by mouth daily. For Women   Yes Historical Provider, MD  raloxifene (EVISTA) 60 MG tablet TAKE 1 TABLET DAILY FOR BONES 04/05/15  Yes Wardell Honour, MD  XARELTO 20 MG TABS tablet TAKE 1 TABLET DAILY 06/04/15  Yes Wardell Honour, MD   BP 103/46 mmHg  Pulse 47  Temp(Src) 97.6 F (36.4 C) (Oral)  Resp 18  Ht 5\' 3"  (1.6 m)  Wt 121 lb 12.8 oz (55.248 kg)  BMI 21.58 kg/m2  SpO2 93% Physical Exam  Constitutional: She is oriented to person, place, and time. No distress.  Thin and cachectic.  HENT:  Head: Normocephalic.  Eyes: Conjunctivae are normal. Pupils are equal, round, and reactive to light. No scleral icterus.  Eyes appear sunken. Temporal muscle wasting. Dry mucous membranes. Conjunctiva pale.  Neck: Normal range of motion. Neck supple. No thyromegaly present.  Cardiovascular: Normal rate and regular rhythm.  Exam reveals no gallop and no friction rub.   No murmur heard. Atrial fibrillation. Bradycardic 50s to 60s  Pulmonary/Chest: Effort normal and breath sounds normal. No respiratory distress. She has no wheezes. She has no rales.  Lungs clear and equal. No increased work of breathing.  Abdominal: Soft. Bowel sounds are normal. She exhibits no distension. There is no tenderness. There is no rebound.  Musculoskeletal: Normal range of motion.  Neurological: She is alert and oriented to person, place, and time.  Unable to follow commands. At time will respond with appropriate one word answers to simple questioning. Majority of the time is perseverating and repeats "[employer] [place]"--patient's hometown. No obvious facial droop.  Unable to determine visual acuity or visual fields. Moves all 4 extremities. Without deficit.  Skin: Skin is warm and dry. No rash noted.  Psychiatric: She has a normal mood and affect. Her behavior is normal.    ED Course  Procedures (including critical care time) Labs Review Labs Reviewed  URINALYSIS, ROUTINE W REFLEX MICROSCOPIC (NOT AT [facility]) - Abnormal; Notable for the following:    Protein, ur TRACE (*)    All other components within normal limits  CBC WITH DIFFERENTIAL/PLATELET - Abnormal; Notable for the following:    Hemoglobin 8.8 (*)    HCT 30.4 (*)    MCV 76.0 (*)    MCH 22.0 (*)    MCHC 28.9 (*)    RDW 24.5 (*)    All other components within normal limits  COMPREHENSIVE METABOLIC PANEL - Abnormal; Notable for the following:    Potassium 3.1 (*)  Glucose, Bld 118 (*)    Creatinine, Ser 1.12 (*)    Calcium 8.3 (*)    Total Protein 6.1 (*)    Albumin 2.7 (*)    ALT 10 (*)    GFR calc non Af Amer 44 (*)    GFR calc Af Amer 52 (*)    All other components within normal limits  CK - Abnormal; Notable for the following:    Total CK 27 (*)    All other components within normal limits  URINE MICROSCOPIC-ADD ON - Abnormal; Notable for the following:    Squamous Epithelial / LPF 0-5 (*)    Casts HYALINE CASTS (*)    All other components within normal limits  MAGNESIUM - Abnormal; Notable for the following:    Magnesium 1.3 (*)    All other components within normal limits  TSH - Abnormal; Notable for the following:    TSH 11.721 (*)    All other components within normal limits  BRAIN NATRIURETIC PEPTIDE - Abnormal; Notable for the following:    B Natriuretic Peptide 2279.0 (*)    All other components within normal limits  COMPREHENSIVE METABOLIC PANEL - Abnormal; Notable for the following:    Potassium 3.1 (*)    Glucose, Bld 129 (*)    Creatinine, Ser 1.27 (*)    Calcium 8.0 (*)    Total Protein 5.5 (*)    Albumin 2.5 (*)    ALT 10 (*)    GFR calc non Af Amer  38 (*)    GFR calc Af Amer 44 (*)    All other components within normal limits  CBC - Abnormal; Notable for the following:    RBC 3.72 (*)    Hemoglobin 8.0 (*)    HCT 28.6 (*)    MCV 76.9 (*)    MCH 21.5 (*)    MCHC 28.0 (*)    RDW 25.0 (*)    All other components within normal limits  HEMOGLOBIN A1C - Abnormal; Notable for the following:    Hgb A1c MFr Bld 6.5 (*)    All other components within normal limits  FOLATE RBC - Abnormal; Notable for the following:    Hematocrit 29.2 (*)    All other components within normal limits  PREALBUMIN - Abnormal; Notable for the following:    Prealbumin 10.5 (*)    All other components within normal limits  GLUCOSE, CAPILLARY - Abnormal; Notable for the following:    Glucose-Capillary 103 (*)    All other components within normal limits  BLOOD GAS, ARTERIAL - Abnormal; Notable for the following:    pCO2 arterial 30.7 (*)    pO2, Arterial 71.3 (*)    Acid-base deficit 5.3 (*)    All other components within normal limits  GLUCOSE, CAPILLARY - Abnormal; Notable for the following:    Glucose-Capillary 138 (*)    All other components within normal limits  GLUCOSE, CAPILLARY - Abnormal; Notable for the following:    Glucose-Capillary 110 (*)    All other components within normal limits  BASIC METABOLIC PANEL - Abnormal; Notable for the following:    Sodium 146 (*)    Potassium 3.1 (*)    Chloride 99 (*)    CO2 33 (*)    Creatinine, Ser 1.16 (*)    Calcium 8.6 (*)    GFR calc non Af Amer 43 (*)    GFR calc Af Amer 49 (*)    All other components within normal limits  BASIC METABOLIC PANEL - Abnormal; Notable for the following:    Sodium 148 (*)    Potassium 3.0 (*)    Chloride 99 (*)    CO2 36 (*)    BUN 23 (*)    Creatinine, Ser 1.31 (*)    Calcium 8.6 (*)    GFR calc non Af Amer 37 (*)    GFR calc Af Amer 43 (*)    All other components within normal limits  CBC - Abnormal; Notable for the following:    Hemoglobin 9.5 (*)    HCT  33.0 (*)    MCV 76.7 (*)    MCH 22.1 (*)    MCHC 28.8 (*)    RDW 24.4 (*)    All other components within normal limits  BASIC METABOLIC PANEL - Abnormal; Notable for the following:    Sodium 148 (*)    Potassium 3.1 (*)    Chloride 99 (*)    CO2 35 (*)    Glucose, Bld 117 (*)    BUN 25 (*)    Creatinine, Ser 1.46 (*)    Calcium 8.5 (*)    GFR calc non Af Amer 32 (*)    GFR calc Af Amer 37 (*)    All other components within normal limits  GLUCOSE, CAPILLARY - Abnormal; Notable for the following:    Glucose-Capillary 105 (*)    All other components within normal limits  GLUCOSE, CAPILLARY - Abnormal; Notable for the following:    Glucose-Capillary 114 (*)    All other components within normal limits  URINE CULTURE  ETHANOL  URINE RAPID DRUG SCREEN, HOSP PERFORMED  TROPONIN I  TROPONIN I  TROPONIN I  T4, FREE  AMMONIA  VITAMIN B12  RPR  HIV ANTIBODY (ROUTINE TESTING)  LACTIC ACID, PLASMA  LACTIC ACID, PLASMA  GLUCOSE, CAPILLARY  PROCALCITONIN  GLUCOSE, CAPILLARY  GLUCOSE, CAPILLARY  GLUCOSE, CAPILLARY  GLUCOSE, CAPILLARY  PROCALCITONIN  GLUCOSE, CAPILLARY  GLUCOSE, CAPILLARY  GLUCOSE, CAPILLARY  GLUCOSE, CAPILLARY  GLUCOSE, CAPILLARY  GLUCOSE, CAPILLARY  PROCALCITONIN  BASIC METABOLIC PANEL  CBG MONITORING, ED  OCCULT BLOOD, POC DEVICE    Imaging Review Ct Head Wo Contrast  07/01/2015  CLINICAL DATA:  Mental status change. EXAM: CT HEAD WITHOUT CONTRAST TECHNIQUE: Contiguous axial images were obtained from the base of the skull through the vertex without intravenous contrast. COMPARISON:  06/30/2015 FINDINGS: Brain: No evidence of acute infarction, hemorrhage, extra-axial collection, ventriculomegaly, or mass effect. There is stable moderate brain parenchymal atrophy and chronic small vessel disease changes. Old right basal ganglia lacunar infarcts are also stable. Vascular: No hyperdense vessel or unexpected calcification. Skull: Negative for fracture or focal  lesion. Sinuses/Orbits: Stable polypoid mucosal thickening of the right maxillary sinus, otherwise no acute findings. Other: None. IMPRESSION: No acute intracranial abnormality. Moderate brain parenchymal atrophy and chronic findings of microvascular disease. Electronically Signed   By: Fidela Salisbury M.D.   On: 07/01/2015 19:36   I have personally reviewed and evaluated these images and lab results as part of my medical decision-making.   EKG Interpretation   Date/Time:  Friday June 28 2015 17:03:24 EST Ventricular Rate:  52 PR Interval:    QRS Duration: 99 QT Interval:  469 QTC Calculation: 436 R Axis:   13 Text Interpretation:  Atrial fibrillation Ventricular premature complex  Borderline repolarization abnormality ED PHYSICIAN INTERPRETATION  AVAILABLE IN [facility] Confirmed by TEST, Record ([id number]) on  06/29/2015 10:03:12 AM      MDM  Final diagnoses:  Delirium    Rectal exam performed. It is guaiac negative. Hemoglobin 8.8. It was 9.3 last night. Most recent other laboratory was 9/14, and was 13.3. CT does not show acute findings or hemorrhage.   No marked or acute abnormalities of left right. K3.1. Creatinine 1.12.  Does not have defect in her level of consciousness. She will intermittently sleep sleep. But will awaken to voice but is unable to participate or answer questions. He is unable to stand, or walk.     Tanna Furry, MD 07-11-2015 [phone]

## 2015-06-28 NOTE — ED Notes (Signed)
Pt now talking in complete sentences, after being assessed by the MD.

## 2015-06-28 NOTE — ED Notes (Signed)
Officer at the bedside, signing off, sitter now at the bedside.

## 2015-06-28 NOTE — H&P (Signed)
[facility] History and Physical  LORELEY ZERBE [id number] DOB: 1930/12/21 DOA: 07/08/2015  Referring physician: ED physician PCP: Wardell Honour, MD  Specialists:   Chief Complaint: Brought in by PD on IVC for self-neglect  HPI: Kathy Robbins is an unfortunate 82 y.o. female with PMH of chronic atrial fibrillation on several toe, hypertension, type 2 diabetes, and hypothyroidism who is brought into the ED by the police department after she was placed on IVC by social worker for grave disability self-neglect. Per review of chart notes and discussions with the ED personnel, a social worker arrived at the patient's house today for welfare check and found the conditions to be unsafe. Specifically, there was insect infestation, rotting food throughout the house that the patient was reportedly eating, and garbage piled throughout the residence. The patient was reportedly seen at a hospital in [place] earlier this week following a fall, but left prior to receiving treatment, prompting the social worker visit today. One of the patient's sons arrived at the ED and provided additional history, reporting that the patient has refused help from family for many years. He states that her home has been hazardous for years with so much refuse throughout the house that there is barely enough room for an adult to walk through. Her son reports that the home is infested with rats and roaches, in that he had passed social work to intervene approximately 2 years ago but they closed the case without taking any action.  In ED, patient was found to be afebrile, saturating well on room air and with vital signs stable. She was unable or unwilling to answer any questions, reportedly stating, "never, never, never" to all questioning. Head CT was without acute intracranial abnormality and chest x-ray demonstrates only chronic, stable findings. EKG featured atrial fibrillation, not significantly changed from priors.  Basic blood work demonstrates hypokalemia, elevated serum creatinine, normocytic anemia, and albumin of only 2.7. Urine tox screen and blood ethanol levels were negative, the patient remained stable in the emergency department, and she was admitted to the hospital for ongoing evaluation and management of the acute encephalopathy.   Where does patient live?   At home   Can patient participate in ADLs?   Some   Review of Systems:   Unable to obtain secondary to clinical condition with altered mental status.   Allergy:  Allergies  Allergen Reactions  . Iohexol      Code: HIVES, Desc: AFTER CT PT HAD HIVES,SOME SOB TREATED BY MD [facility] NEEDS PRE.MEDS.NEXT TIME, Onset Date: [id number]     Past Medical History  Diagnosis Date  . HTN (hypertension)   . History of colon cancer   . Atrial fibrillation or flutter   . Hyperlipidemia   . Thyroid disease     hypothyroidism  . Diabetes mellitus     NIDDM  . Osteopenia   . Arthritis   . Cancer [facility]) July 2013    Throat    Past Surgical History  Procedure Laterality Date  . 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 Lymph node biopsy      removal  . Colonoscopy  Jan. 2013    6 polyps  removed    Social History:  reports that she quit smoking about 52 years ago. She has never used smokeless tobacco. She reports that she does not drink alcohol or use illicit drugs.  Family History:  Family History  Problem Relation Age  of Onset  . Cancer Mother     "female organs"  . Stroke Mother 20     Prior to Admission medications   Medication Sig Start Date End Date Taking? Authorizing Provider  atorvastatin (LIPITOR) 10 MG tablet TAKE 1 TABLET DAILY 04/05/15  Yes Wardell Honour, MD  benazepril (LOTENSIN) 40 MG tablet TAKE 1 TABLET DAILY 04/05/15  Yes Wardell Honour, MD  cholecalciferol (VITAMIN D) 1000 UNITS tablet Take 1,000 Units by mouth daily.   Yes Historical Provider, MD  diltiazem (CARDIZEM CD)  300 MG 24 hr capsule TAKE (1) CAPSULE DAILY 06/04/15  Yes Wardell Honour, MD  hydrochlorothiazide (HYDRODIURIL) 25 MG tablet TAKE 1 TABLET DAILY 04/05/15  Yes Wardell Honour, MD  levothyroxine (SYNTHROID, LEVOTHROID) 75 MCG tablet TAKE (1) TABLET DAILY BE- FORE BREAKFAST. 03/07/15  Yes Wardell Honour, MD  metFORMIN (GLUCOPHAGE) 500 MG tablet TAKE (1) TABLET TWICE A DAY WITH MEALS (BREAKFAST AND SUPPER) 04/05/15  Yes Wardell Honour, MD  metoprolol (LOPRESSOR) 50 MG tablet TAKE (1) TABLET TWICE A DAY. 04/05/15  Yes Wardell Honour, MD  raloxifene (EVISTA) 60 MG tablet TAKE 1 TABLET DAILY FOR BONES 04/05/15  Yes Wardell Honour, MD  XARELTO 20 MG TABS tablet TAKE 1 TABLET DAILY 06/04/15  Yes Wardell Honour, MD  atorvastatin (LIPITOR) 10 MG tablet Take 1 tablet (10 mg total) by mouth daily. 11/08/13   Sharion Balloon, FNP  calcium carbonate (OS-CAL) 600 MG TABS Take 600 mg by mouth daily.    Historical Provider, MD  fish oil-omega-3 fatty acids 1000 MG capsule Take 1 g by mouth daily.      Historical Provider, MD  Multiple Vitamins-Minerals (CENTRUM SILVER PO) Take by mouth daily. For Women    Historical Provider, MD    Physical Exam: Filed Vitals:   06/27/2015 1930 06/17/2015 2000 07/09/2015 2030 07/12/2015 2100  BP: 116/57 132/57 128/53 141/65  Pulse:    60  Temp:      TempSrc:      Resp: 18 17 16 16   SpO2:    95%   General: Not in acute distress HEENT:       Eyes: PERRL, EOMI, no scleral icterus or conjunctival pallor.       ENT: No discharge from the ears or nose, no pharyngeal ulcers, petechiae or exudate.        Neck: No JVD, no bruit, no appreciable mass Heme: No cervical adenopathy, no pallor Cardiac: Rate ~80 and irregular with soft early diastolic murmur at lower left sternal border, No gallops or rubs. Pulm: Good air movement bilaterally. No rales, wheezing, rhonchi or rubs. Abd: Soft, nondistended, nontender, no rebound pain or gaurding, no mass or organomegaly, BS  present. Ext: No LE edema bilaterally. 2+DP/PT pulse bilaterally. Musculoskeletal: No gross deformity, no red, hot, swollen joints, no limitation in ROM  Skin: No rashes or wounds on exposed surfaces  Neuro: Alert, making eye-contact, offering 1 word answers inconsistently, PERRL, EOMI, no facial asymmetry, moving all extremities, patellar reflex 2+ bilaterally. Negative Babinski's sign. No focal findings Psych: Not overtly psychotic, does not seem to attend to external stimuli; makes consistent eye-contact; slightly disheveled.  Labs on Admission:  Basic Metabolic Panel:  Recent Labs Lab 07/06/2015 1730  NA 144  K 3.1*  CL 105  CO2 28  GLUCOSE 118*  BUN 13  CREATININE 1.12*  CALCIUM 8.3*   Liver Function Tests:  Recent Labs Lab 07/10/2015 1730  AST 19  ALT  10*  ALKPHOS 92  BILITOT 0.6  PROT 6.1*  ALBUMIN 2.7*   No results for input(s): LIPASE, AMYLASE in the last 168 hours. No results for input(s): AMMONIA in the last 168 hours. CBC:  Recent Labs Lab 06/18/2015 1730  WBC 6.3  NEUTROABS 4.7  HGB 8.8*  HCT 30.4*  MCV 76.0*  PLT 342   Cardiac Enzymes:  Recent Labs Lab 07/14/2015 1730  CKTOTAL 27*    BNP (last 3 results) No results for input(s): BNP in the last 8760 hours.  ProBNP (last 3 results) No results for input(s): PROBNP in the last 8760 hours.  CBG:  Recent Labs Lab 07/10/2015 1707  GLUCAP 99    Radiological Exams on Admission: Ct Head Wo Contrast  06/18/2015  CLINICAL DATA:  Altered mental status.  Falls.  On anticoagulation. EXAM: CT HEAD WITHOUT CONTRAST TECHNIQUE: Contiguous axial images were obtained from the base of the skull through the vertex without intravenous contrast. COMPARISON:  None. FINDINGS: No evidence of intracranial hemorrhage, brain edema, or other signs of acute infarction. No evidence of intracranial mass lesion or mass effect. No abnormal extraaxial fluid collections identified. Ventricles are normal in size. No skull  abnormality identified. Moderate diffuse cerebral atrophy noted as well as mild chronic small vessel disease. Old lacunar infarcts seen involving the right basal ganglia. No evidence of obstructive hydrocephalus. No skull fracture identified. Mucosal thickening seen involving the maxillary sinuses bilaterally. IMPRESSION: No acute intracranial findings. Cerebral atrophy and chronic small vessel disease. Electronically Signed   By: Earle Gell M.D.   On: 06/20/2015 19:52   Dg Chest Port 1 View  07/15/2015  CLINICAL DATA:  Altered mental status. Recent falls. Atrial fibrillation. Personal history of colon on throat cancer. EXAM: PORTABLE CHEST 1 VIEW COMPARISON:  06/25/2015 FINDINGS: Moderate to severe cardiomegaly stable. Right apical scarring is stable in appearance. Pulmonary hyperinflation is suspicious for COPD. No evidence of pulmonary consolidation or pleural effusion. IMPRESSION: Stable cardiomegaly, right apical scarring, and probable COPD. No acute findings. Electronically Signed   By: Earle Gell M.D.   On: 06/20/2015 18:14    EKG: Independently reviewed.  Abnormal findings:   Atrial fibrillation, no significant change from priors  Assessment/Plan  1. Encephalopathy  - Etiology is not clear at this time  - There is no suggestion of infection on initial workup - CT is negative for organic cause  - Treating electrolyte derangements  - Further investigation with thyroid studies, B12, folate, ammonia, RPR pending  - No focal neuro deficits elicited, though is a difficult exam d/t inconsistent cooperation  - If condition persists and pending labs unrevealing, may need to pursue advanced brain imaging with MRI   2. Self-neglect  - Social work has been consulted to assist with dispo planning as seems pt is not safe to return home per report   3. Atrial fibrillation, chronic  - Continue Xarelto  - Continue diltiazem and metoprolol at decreased dose given marginal BPs in the ED  - Monitor  rate on telemetry   4. Hypertension  - BP was marginal in ED  - Continue benazepril at reduced dose, hold HCTZ  - Resume home-dose antihypertensives as appropriate; would consider not resuming HCTZ in setting of significant hypokalemia    5. Hypokalemia  - K+ 3.1 on admission  - May be secondary to HCTZ  - Holding HCTZ, consider discontinuing  - Replacing in IVF, PO  - Repeat chem panel in the am    6. Protein-calorie malnutrition  -  Pt has bitemporal wasting on exam  - Albumin is 2.7  - Nutritionist consultation requested for dietary recs   7. Type II diabetes  - Hold home metformin  - Check CBGs with meals and qHS  - Sensitive SSI correctional regimen, adjust prn  - A1c pending; was 6.3% 02/05/2015, suggesting excellent control     DVT ppx:  Continue Xarelto   Code Status: Full code Family Communication:  Yes, patient's son, Deon Buick, at bed side Disposition Plan: Admit to inpatient   Date of Service 06/25/2015    Vianne Bulls, MD [facility] Pager [phone]  If 7PM-7AM, please contact night-coverage [email or web address] Password [facility] 07/02/2015, 9:26 PM
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
## 2015-06-29 ENCOUNTER — Encounter (HOSPITAL_COMMUNITY): Payer: Self-pay

## 2015-06-29 DIAGNOSIS — E039 Hypothyroidism, unspecified: Secondary | ICD-10-CM

## 2015-06-29 DIAGNOSIS — I5043 Acute on chronic combined systolic (congestive) and diastolic (congestive) heart failure: Secondary | ICD-10-CM

## 2015-06-29 DIAGNOSIS — R001 Bradycardia, unspecified: Secondary | ICD-10-CM | POA: Diagnosis present

## 2015-06-29 DIAGNOSIS — Z7901 Long term (current) use of anticoagulants: Secondary | ICD-10-CM

## 2015-06-29 DIAGNOSIS — I5031 Acute diastolic (congestive) heart failure: Secondary | ICD-10-CM | POA: Diagnosis present

## 2015-06-29 LAB — BLOOD GAS, ARTERIAL
Acid-base deficit: 5.3 mmol/L — ABNORMAL HIGH (ref 0.0–2.0)
BICARBONATE: 20.2 meq/L (ref 20.0–24.0)
Drawn by: 23534
O2 CONTENT: 12 L/min
O2 Saturation: 90.8 %
PCO2 ART: 30.7 mmHg — AB (ref 35.0–45.0)
PH ART: 7.4 (ref 7.350–7.450)
PO2 ART: 71.3 mmHg — AB (ref 80.0–100.0)

## 2015-06-29 LAB — COMPREHENSIVE METABOLIC PANEL
ALK PHOS: 88 U/L (ref 38–126)
ALT: 10 U/L — AB (ref 14–54)
AST: 19 U/L (ref 15–41)
Albumin: 2.5 g/dL — ABNORMAL LOW (ref 3.5–5.0)
Anion gap: 13 (ref 5–15)
BILIRUBIN TOTAL: 0.8 mg/dL (ref 0.3–1.2)
BUN: 16 mg/dL (ref 6–20)
CALCIUM: 8 mg/dL — AB (ref 8.9–10.3)
CO2: 23 mmol/L (ref 22–32)
CREATININE: 1.27 mg/dL — AB (ref 0.44–1.00)
Chloride: 105 mmol/L (ref 101–111)
GFR, EST AFRICAN AMERICAN: 44 mL/min — AB (ref >60)
GFR, EST NON AFRICAN AMERICAN: 38 mL/min — AB (ref >60)
Glucose, Bld: 129 mg/dL — ABNORMAL HIGH (ref 65–99)
Potassium: 3.1 mmol/L — ABNORMAL LOW (ref 3.5–5.1)
Sodium: 141 mmol/L (ref 135–145)
TOTAL PROTEIN: 5.5 g/dL — AB (ref 6.5–8.1)

## 2015-06-29 LAB — GLUCOSE, CAPILLARY
GLUCOSE-CAPILLARY: 103 mg/dL — AB (ref 65–99)
GLUCOSE-CAPILLARY: 138 mg/dL — AB (ref 65–99)
GLUCOSE-CAPILLARY: 84 mg/dL (ref 65–99)
Glucose-Capillary: 110 mg/dL — ABNORMAL HIGH (ref 65–99)
Glucose-Capillary: 90 mg/dL (ref 65–99)

## 2015-06-29 LAB — CBC
HCT: 28.6 % — ABNORMAL LOW (ref 36.0–46.0)
Hemoglobin: 8 g/dL — ABNORMAL LOW (ref 12.0–15.0)
MCH: 21.5 pg — AB (ref 26.0–34.0)
MCHC: 28 g/dL — ABNORMAL LOW (ref 30.0–36.0)
MCV: 76.9 fL — ABNORMAL LOW (ref 78.0–100.0)
PLATELETS: 355 10*3/uL (ref 150–400)
RBC: 3.72 MIL/uL — AB (ref 3.87–5.11)
RDW: 25 % — ABNORMAL HIGH (ref 11.5–15.5)
WBC: 5.5 10*3/uL (ref 4.0–10.5)

## 2015-06-29 LAB — TROPONIN I
Troponin I: 0.03 ng/mL (ref <0.031)
Troponin I: 0.03 ng/mL (ref <0.031)

## 2015-06-29 LAB — PREALBUMIN: PREALBUMIN: 10.5 mg/dL — AB (ref 18–38)

## 2015-06-29 LAB — VITAMIN B12: VITAMIN B 12: 297 pg/mL (ref 180–914)

## 2015-06-29 LAB — LACTIC ACID, PLASMA: Lactic Acid, Venous: 1.7 mmol/L (ref 0.5–2.0)

## 2015-06-29 LAB — PROCALCITONIN: PROCALCITONIN: 0.14 ng/mL

## 2015-06-29 LAB — T4, FREE: FREE T4: 1.03 ng/dL (ref 0.61–1.12)

## 2015-06-29 MED ORDER — ENSURE ENLIVE PO LIQD: 237.0000 mL | Freq: Two times a day (BID) | ORAL | Status: DC

## 2015-06-29 MED ORDER — MAGNESIUM SULFATE IN D5W 10-5 MG/ML-% IV SOLN
INTRAVENOUS | Status: AC
  Filled 2015-06-29: qty 100

## 2015-06-29 MED ORDER — ENOXAPARIN SODIUM 30 MG/0.3ML ~~LOC~~ SOLN
30.0000 mg | SUBCUTANEOUS | Status: DC
  Administered 2015-06-29 – 2015-07-02 (×4): 30 mg via SUBCUTANEOUS
  Filled 2015-06-29 (×4): qty 0.3

## 2015-06-29 MED ORDER — FUROSEMIDE 10 MG/ML IJ SOLN
20.0000 mg | Freq: Two times a day (BID) | INTRAMUSCULAR | Status: DC
  Administered 2015-06-29 – 2015-07-01 (×6): 20 mg via INTRAVENOUS
  Filled 2015-06-29 (×7): qty 2

## 2015-06-29 MED ORDER — LORAZEPAM 2 MG/ML IJ SOLN
0.5000 mg | Freq: Once | INTRAMUSCULAR | Status: AC
  Administered 2015-06-29: 0.5 mg via INTRAVENOUS
  Filled 2015-06-29: qty 1

## 2015-06-29 MED ORDER — MAGNESIUM SULFATE IN D5W 10-5 MG/ML-% IV SOLN
1.0000 g | Freq: Once | INTRAVENOUS | Status: AC
  Administered 2015-06-29: 1 g via INTRAVENOUS
  Filled 2015-06-29: qty 100

## 2015-06-29 MED ORDER — LEVOTHYROXINE SODIUM 100 MCG IV SOLR
75.0000 ug | Freq: Every day | INTRAVENOUS | Status: DC
  Administered 2015-06-29 – 2015-07-03 (×5): 75 ug via INTRAVENOUS
  Filled 2015-06-29 (×8): qty 5

## 2015-06-29 MED ORDER — FUROSEMIDE 10 MG/ML IJ SOLN
20.0000 mg | Freq: Once | INTRAMUSCULAR | Status: AC
  Administered 2015-06-29: 20 mg via INTRAVENOUS

## 2015-06-29 NOTE — Progress Notes (Signed)
Patient oxygen saturation drops because of patient being confused , Oxygen increased from 8 to 10 lpm hf nasal cannula. Nurse already gave some sedation.

## 2015-06-29 NOTE — Progress Notes (Signed)
ANTICOAGULATION CONSULT NOTE - Initial Consult  Pharmacy Consult for Lovenox Indication: atrial fibrillation  Allergies  Allergen Reactions  . Iohexol      Code: HIVES, Desc: AFTER CT PT HAD HIVES,SOME SOB TREATED BY MD [facility] NEEDS PRE.MEDS.NEXT TIME, Onset Date: [id number]     Patient Measurements: Height: 5\' 3"  (160 cm) Weight: 134 lb 6.4 oz (60.963 kg) IBW/kg (Calculated) : 52.4  Vital Signs: Temp: 96.1 F (35.6 C) (01/14 0500) Temp Source: Oral (01/14 0500) BP: 171/45 mmHg (01/14 1500) Pulse Rate: 49 (01/14 1500)  Labs:  Recent Labs  07/04/2015 1730 06/27/2015 2158 06/29/15 0421 06/29/15 0905  HGB 8.8*  --  8.0*  --   HCT 30.4*  --  28.6*  --   PLT 342  --  355  --   CREATININE 1.12*  --  1.27*  --   CKTOTAL 27*  --   --   --   TROPONINI  --  0.03 0.03 0.03    Estimated Creatinine Clearance: 28.3 mL/min (by C-G formula based on Cr of 1.27).   Medical History: Past Medical History  Diagnosis Date  . HTN (hypertension)   . History of colon cancer   . Atrial fibrillation or flutter   . Hyperlipidemia   . Thyroid disease     hypothyroidism  . Diabetes mellitus     NIDDM  . Osteopenia   . Arthritis   . Cancer [facility] July 2013    Throat    Medications:  Prescriptions prior to admission  Medication Sig Dispense Refill Last Dose  . atorvastatin (LIPITOR) 10 MG tablet TAKE 1 TABLET DAILY 30 tablet 3 Past Month at Unknown time  . benazepril (LOTENSIN) 40 MG tablet TAKE 1 TABLET DAILY 30 tablet 3 Past Month at Unknown time  . calcium carbonate (OS-CAL) 600 MG TABS Take 600 mg by mouth daily.   Past Month at Unknown time  . cholecalciferol (VITAMIN D) 1000 UNITS tablet Take 1,000 Units by mouth daily.   Past Month at Unknown time  . diltiazem (CARDIZEM CD) 300 MG 24 hr capsule TAKE (1) CAPSULE DAILY 30 capsule 0 Past Month at Unknown time  . fish oil-omega-3 fatty acids 1000 MG capsule Take 1 g by mouth daily.     Past Month at Unknown time  .  hydrochlorothiazide (HYDRODIURIL) 25 MG tablet TAKE 1 TABLET DAILY 30 tablet 3 Past Month at Unknown time  . levothyroxine (SYNTHROID, LEVOTHROID) 75 MCG tablet TAKE (1) TABLET DAILY BE- FORE BREAKFAST. 30 tablet 10 Past Month at Unknown time  . metFORMIN (GLUCOPHAGE) 500 MG tablet TAKE (1) TABLET TWICE A DAY WITH MEALS (BREAKFAST AND SUPPER) 60 tablet 3 Past Month at Unknown time  . metoprolol (LOPRESSOR) 50 MG tablet TAKE (1) TABLET TWICE A DAY. 60 tablet 3 Past Month at Unknown time  . Multiple Vitamins-Minerals (CENTRUM SILVER PO) Take 1 tablet by mouth daily. For Women   Past Month at Unknown time  . raloxifene (EVISTA) 60 MG tablet TAKE 1 TABLET DAILY FOR BONES 30 tablet 3 Past Month at Unknown time  . XARELTO 20 MG TABS tablet TAKE 1 TABLET DAILY 30 tablet 0 Past Month at Unknown time    Assessment: 82yo female on Xarelto for afib.  Pt not taking po's.  Asked to switch to Lovenox.  Goal of Therapy:  Anti-Xa level 0.6-1 units/ml 4hrs after LMWH dose given Monitor platelets by anticoagulation protocol: Yes   Plan:  Lovenox 30mg  SQ q24h (1mg /kg per dose) Monitor CBC  Hart Robinsons A 06/29/2015,3:52 PM

## 2015-06-29 NOTE — Progress Notes (Signed)
Initial Nutrition Assessment  DOCUMENTATION CODES:  Not applicable  INTERVENTION:  Ensure Enlive po BID, each supplement provides 350 kcal and 20 grams of protein  Multivitamin  Per son, pt was an alcoholic. Though her UA was negative for ethanol, may be considered appropriate for thiamin/folate supp  NUTRITION DIAGNOSIS:  Intake of unsafe food related to social / environmental circumstances/self neglect as evidenced by  eating rotting food in infested environement.  GOAL:  Patient will meet greater than or equal to 90% of their needs  MONITOR:  PO intake, Supplement acceptance, Diet advancement  REASON FOR ASSESSMENT:  Consult  ("Malnourished, dietary recs")  ASSESSMENT:  82 y/o female PMHx chronic A fib, HLD, Cancer,  HTN, dm2, hypothyroidism who was brought in on IVC for self neglect.   RD operating remotely. Per notes, pt was living in unsafe and unsanitary conditions. She would eat rotten food and her house was infested with roaches/rats. Given that pt has maintained her weight, it would appear she was able to eat enough to meet needs, though unable to do physical exam as weight loss could be masked by edema.   Current weight is 134. Pts weight appears to have been stable for the last year.   NFPE: Unable to conduct.   Labs reviewed: Hypokalemia, BNP >2k, hypomagnesemia, anemic  Diet Order:  Diet heart healthy/carb modified Room service appropriate?: Yes; Fluid consistency:: Thin  Skin: pale, dry, scattered bruises, ecchymotic foot.   Last BM:  1/13  Height:  Ht Readings from Last 1 Encounters:  06/24/2015 5\' 3"  (1.6 m)   Weight:  Wt Readings from Last 1 Encounters:  07/06/2015 134 lb 6.4 oz (60.963 kg)   Wt Readings from Last 10 Encounters:  07/01/2015 134 lb 6.4 oz (60.963 kg)  02/05/15 131 lb 3.2 oz (59.512 kg)  10/04/14 131 lb (59.421 kg)  08/09/14 132 lb (59.875 kg)  05/17/14 132 lb 6.4 oz (60.056 kg)  02/12/14 132 lb 12.8 oz (60.238 kg)  02/01/14 129 lb  (58.514 kg)  01/25/14 167 lb (75.751 kg)  11/08/13 136 lb 9.6 oz (61.961 kg)  09/11/13 148 lb 11.2 oz (67.45 kg)   Ideal Body Weight:  52.27 kg  BMI:  Body mass index is 23.81 kg/(m^2).  Estimated Nutritional Needs:  Kcal:  1500-1700 kcals (25-28 kcal/kg bw) Protein:  61-73 g (1-1.2 g/kg bw) Fluid:  1.5-1.7 lliters fluid  EDUCATION NEEDS:  No education needs identified at this time  Burtis Junes RD, LDN Nutrition Pager: [phone] 06/29/2015 8:57 AM

## 2015-06-29 NOTE — Progress Notes (Signed)
PROGRESS NOTE  Kathy Robbins [id number] DOB: [birth year] DOA: 06/25/2015 PCP: Wardell Honour, MD  HPI/Recap of past 24 hours: Patient is an 82 year old female past medical history of chronic atrial fibrillation on anticoagulation, diabetes mellitus and hypothyroidism and previous alcoholic who lives alone and was brought in by police to the emergency room on 1/13 after being involuntarily committed by social work for self neglect and inability to care for herself. Social worker had been to patient's house earlier that day for welfare check and found conditions unsafe for patient. Patient reportedly had been eating rotted food and garbage out to her residence. She had been reportedly at [facility] week prior, but left before receiving treatment. According to patient's son in the emergency room, patient has refused help from family for many years and that the family has been asking for social work intervention for some time now. Patient not really interacting or answering questions to nurses or physicians. CT scan of head unremarkable. EKG noted atrial fibrillation and bradycardia. Initial labs noted mild acute kidney injury with creatinine 1.27 (was normal in August of last year) , but BNP noted to be 2279. Lactic acid level within normal range. TSH at 11.72.  Patient initially started on IV fluids, but this morning, oxygen saturations noted to be in the 70s. Her crying 5 L. Patient noted to be sounding very wet. Started on IV diuresis and Foley placed. Previous echo noted? Diastolic dysfunction. Patient refusing to take by mouth medications. She will not give any type of subjective complaints  Assessment/Plan: Principal Problem:   Acute on chronic combined systolic and diastolic heart failure ([place]): Rechecking echo. IV diuresis. Unable to take by mouth medications at this time and blood pressure soft soled holding on beta blocker and ACE inhibitor Active Problems:   Hypertension: As  above   Hypothyroidism: Suspect patient has not been on her Synthroid for some time and is may account for some of her confusion. Have started IV Synthroid   Type 2 diabetes mellitus [facility]): Checking A1c, sliding scale   Atrial fibrillation : Rate controlled. On Lovenox.   Carotid stenosis   Encephalopathy: Secondary to hypothyroidism versus dementia?   Protein calorie malnutrition ([place]): Being followed by nutrition   Bradycardia: Secondary to hypothyroidism   Chronic anticoagulation Hypokalemia: Severe dehydration, poor nutrition. Probably worsen with diuresis, will replace accordingly   Code Status: Full code   Family Communication: Left message for son   Disposition Plan:  Likely will be her for several days. Placement may be difficult   Consultants:  None   Procedures:  Echocardiogram pending   Antibiotics:  None    Objective: BP 98/42 mmHg  Pulse 79  Temp(Src) 96.1 F (35.6 C) (Oral)  Resp 16  Ht 5\' 3"  (1.6 m)  Wt 60.963 kg (134 lb 6.4 oz)  BMI 23.81 kg/m2  SpO2 92%  Intake/Output Summary (Last 24 hours) at 06/29/15 1500 Last data filed at 06/29/15 0800  Gross per 24 hour  Intake    120 ml  Output     13 ml  Net    107 ml   Filed Weights   06/21/2015 2359  Weight: 60.963 kg (134 lb 6.4 oz)    Exam:   General:  oriented 1?   Cardiovascular: irregular rhythm, bradycardic   Respiratory: bilateral crackles   Abdomen: soft, nontender, nondistended, positive bowel sounds   Musculoskeletal: 1+ pitting edema bilaterally    Data Reviewed: Basic Metabolic Panel:  Recent Labs Lab 06/22/2015  1730 07/08/2015 2158 06/29/15 0421  NA 144  --  141  K 3.1*  --  3.1*  CL 105  --  105  CO2 28  --  23  GLUCOSE 118*  --  129*  BUN 13  --  16  CREATININE 1.12*  --  1.27*  CALCIUM 8.3*  --  8.0*  MG  --  1.3*  --    Liver Function Tests:  Recent Labs Lab 07/16/2015 1730 06/29/15 0421  AST 19 19  ALT 10* 10*  ALKPHOS 92 88  BILITOT 0.6 0.8  PROT  6.1* 5.5*  ALBUMIN 2.7* 2.5*   No results for input(s): LIPASE, AMYLASE in the last 168 hours.  Recent Labs Lab 06/18/2015 2158  AMMONIA 9   CBC:  Recent Labs Lab 07/04/2015 1730 06/29/15 0421  WBC 6.3 5.5  NEUTROABS 4.7  --   HGB 8.8* 8.0*  HCT 30.4* 28.6*  MCV 76.0* 76.9*  PLT 342 355   Cardiac Enzymes:    Recent Labs Lab 06/25/2015 1730 07/08/2015 2158 06/29/15 0421 06/29/15 0905  CKTOTAL 27*  --   --   --   TROPONINI  --  0.03 0.03 0.03   BNP (last 3 results)  Recent Labs  06/29/2015 2158  BNP 2279.0*    ProBNP (last 3 results) No results for input(s): PROBNP in the last 8760 hours.  CBG:  Recent Labs Lab 06/20/2015 1707 06/17/2015 2359 06/29/15 0728 06/29/15 1138  GLUCAP 99 84 103* 138*    No results found for this or any previous visit (from the past 240 hour(s)).   Studies: Ct Head Wo Contrast  06/29/2015  CLINICAL DATA:  Altered mental status.  Falls.  On anticoagulation. EXAM: CT HEAD WITHOUT CONTRAST TECHNIQUE: Contiguous axial images were obtained from the base of the skull through the vertex without intravenous contrast. COMPARISON:  None. FINDINGS: No evidence of intracranial hemorrhage, brain edema, or other signs of acute infarction. No evidence of intracranial mass lesion or mass effect. No abnormal extraaxial fluid collections identified. Ventricles are normal in size. No skull abnormality identified. Moderate diffuse cerebral atrophy noted as well as mild chronic small vessel disease. Old lacunar infarcts seen involving the right basal ganglia. No evidence of obstructive hydrocephalus. No skull fracture identified. Mucosal thickening seen involving the maxillary sinuses bilaterally. IMPRESSION: No acute intracranial findings. Cerebral atrophy and chronic small vessel disease. Electronically Signed   By: Earle Gell M.D.   On: 06/17/2015 19:52   Dg Chest Port 1 View  07/11/2015  CLINICAL DATA:  Altered mental status. Recent falls. Atrial fibrillation.  Personal history of colon on throat cancer. EXAM: PORTABLE CHEST 1 VIEW COMPARISON:  06/25/2015 FINDINGS: Moderate to severe cardiomegaly stable. Right apical scarring is stable in appearance. Pulmonary hyperinflation is suspicious for COPD. No evidence of pulmonary consolidation or pleural effusion. IMPRESSION: Stable cardiomegaly, right apical scarring, and probable COPD. No acute findings. Electronically Signed   By: Earle Gell M.D.   On: 06/29/2015 18:14    Scheduled Meds: . calcium carbonate  1 tablet Oral Daily  . furosemide  20 mg Intravenous Q12H  . insulin aspart  0-9 Units Subcutaneous TID WC  . levothyroxine  75 mcg Intravenous Daily  . rivaroxaban  20 mg Oral Daily  . sodium chloride  3 mL Intravenous Q12H    Continuous Infusions:    Time spent: 45 minutes   [place] [facility] Pager [phone] . If 7PM-7AM, please contact night-coverage at [email or web address], password [facility]  06/29/2015, 3:00 PM  LOS: 1 day

## 2015-06-30 ENCOUNTER — Inpatient Hospital Stay (HOSPITAL_COMMUNITY): Payer: Medicare Other

## 2015-06-30 DIAGNOSIS — I4891 Unspecified atrial fibrillation: Secondary | ICD-10-CM

## 2015-06-30 LAB — GLUCOSE, CAPILLARY
GLUCOSE-CAPILLARY: 88 mg/dL (ref 65–99)
GLUCOSE-CAPILLARY: 82 mg/dL (ref 65–99)
Glucose-Capillary: 79 mg/dL (ref 65–99)
Glucose-Capillary: 89 mg/dL (ref 65–99)

## 2015-06-30 LAB — RPR: RPR Ser Ql: NONREACTIVE

## 2015-06-30 LAB — HIV ANTIBODY (ROUTINE TESTING W REFLEX): HIV Screen 4th Generation wRfx: NONREACTIVE

## 2015-06-30 MED ORDER — LORAZEPAM 2 MG/ML IJ SOLN: 0.5000 mg | Freq: Four times a day (QID) | INTRAMUSCULAR | Status: DC | PRN

## 2015-06-30 MED ORDER — LORAZEPAM 2 MG/ML IJ SOLN
0.5000 mg | Freq: Once | INTRAMUSCULAR | Status: AC
  Administered 2015-06-30: 0.5 mg via INTRAVENOUS
  Filled 2015-06-30: qty 1

## 2015-06-30 MED ORDER — LORAZEPAM 2 MG/ML IJ SOLN
0.5000 mg | Freq: Four times a day (QID) | INTRAMUSCULAR | Status: DC | PRN
  Administered 2015-06-30 – 2015-07-02 (×6): 0.5 mg via INTRAVENOUS
  Filled 2015-06-30 (×6): qty 1

## 2015-06-30 NOTE — Progress Notes (Signed)
PROGRESS NOTE  Kathy Robbins [id number] DOB: 1930-11-22 DOA: 06/19/2015 PCP: Wardell Honour, MD  HPI/Recap of past 24 hours: Patient is an 82 year old female past medical history of chronic atrial fibrillation on anticoagulation, diabetes mellitus and hypothyroidism and previous alcoholic who lives alone and was brought in by police to the emergency room on 1/13 after being involuntarily committed by social work for self neglect and inability to care for herself. Social worker had been to patient's house earlier that day for welfare check and found conditions unsafe for patient. Patient reportedly had been eating rotted food and garbage out to her residence. She had been reportedly at [facility] week prior, but left before receiving treatment. According to patient's son in the emergency room, patient has refused help from family for many years and that the family has been asking for social work intervention for some time now. Patient not really interacting or answering questions to nurses or physicians. CT scan of head unremarkable. EKG noted atrial fibrillation and bradycardia. Initial labs noted mild acute kidney injury with creatinine 1.27 (was normal in August of last year) , but BNP noted to be 2279. Lactic acid level within normal range. TSH at 11.72.  Patient initially started on IV fluids, but by afternoon of 1/14,oxygen saturations noted to be in the 70s. Her crying 5 L. Patient noted to be sounding very wet. Started on IV diuresis and Foley placed. Previous echo noted? Diastolic dysfunction. Patient refusing to take by mouth medications. She gets more agitated.refusing for labs. With IV Lasix given earlier, patient diuresed several liters. Today breathing more comfortably, although still refuses to answer questions or take medications  Spoke with son and daughter-in-law this morning. They state that the patient overall while difficult and refuses any help, was at least somewhat  coherent a few days ago. They state that this is a change from last week.  Assessment/Plan: Principal Problem:   Acute on chronic combined systolic and diastolic heart failure ([place]): Rechecking echo. IV diuresis. Unable to take by mouth medications at this time and blood pressure soft soled holding on beta blocker and ACE inhibitor. Trying to diurese Active Problems:   Hypertension: As above   Hypothyroidism: Suspect patient has not been on her Synthroid for some time and is may account for some of her confusion. Have started IV Synthroid   Type 2 diabetes mellitus [facility]): Checking A1c, sliding scale   Atrial fibrillation : Rate controlled. On Lovenox.   Carotid stenosis   Encephalopathy: Secondary to hypothyroidism versus dementia?   Protein calorie malnutrition ([place]): Being followed by nutrition   Bradycardia: resolved.   Chronic anticoagulation Hypokalemia: Severe dehydration, poor nutrition. Probably worsen with diuresis, will replace accordingly. Unable to check labs today  Acute encephalopathy: Patient may have had acute CVA. We'll try to sedate and check MRI.   Code Status: Full code   Family Communication: had extensive discussion with son and daughter-in-law. They're going to discuss with other family members. They realized that she's not going to get much better and that going home was not going to be an option for her. They're considering comfort care and hospice. However, not sure if they will be able to make these decisions if she is under the care of the state  Disposition Plan:  Potential discharge to skilled nursing versus hospice. However we'll need to discuss in regards to if under state care   Consultants:  None   Procedures:  Echocardiogram pending   Antibiotics:  None  Objective: BP 148/65 mmHg  Pulse 110  Temp(Src) 97.1 F (36.2 C) (Axillary)  Resp 20  Ht 5\' 3"  (1.6 m)  Wt 60.555 kg (133 lb 8 oz)  BMI 23.65 kg/m2  SpO2 94%  Intake/Output  Summary (Last 24 hours) at 06/30/15 1642 Last data filed at 06/30/15 1200  Gross per 24 hour  Intake      0 ml  Output   2725 ml  Net  -2725 ml   Filed Weights   07/15/2015 2359 06/30/15 0550  Weight: 60.963 kg (134 lb 6.4 oz) 60.555 kg (133 lb 8 oz)    Exam:   General:  Confused, delirious   Cardiovascular: irregular rhythm, borderline tachycardia  Respiratory: bilateral crackles , although better airway exchange than yesterday  Abdomen: soft, nontender, nondistended, positive bowel sounds   Musculoskeletal: 1+ pitting edema bilaterally    Data Reviewed: Basic Metabolic Panel:  Recent Labs Lab 07/13/2015 1730 06/21/2015 2158 06/29/15 0421  NA 144  --  141  K 3.1*  --  3.1*  CL 105  --  105  CO2 28  --  23  GLUCOSE 118*  --  129*  BUN 13  --  16  CREATININE 1.12*  --  1.27*  CALCIUM 8.3*  --  8.0*  MG  --  1.3*  --    Liver Function Tests:  Recent Labs Lab 06/19/2015 1730 06/29/15 0421  AST 19 19  ALT 10* 10*  ALKPHOS 92 88  BILITOT 0.6 0.8  PROT 6.1* 5.5*  ALBUMIN 2.7* 2.5*   No results for input(s): LIPASE, AMYLASE in the last 168 hours.  Recent Labs Lab 06/21/2015 2158  AMMONIA 9   CBC:  Recent Labs Lab 07/02/2015 1730 06/29/15 0421  WBC 6.3 5.5  NEUTROABS 4.7  --   HGB 8.8* 8.0*  HCT 30.4* 28.6*  MCV 76.0* 76.9*  PLT 342 355   Cardiac Enzymes:    Recent Labs Lab 06/16/2015 1730 07/14/2015 2158 06/29/15 0421 06/29/15 0905  CKTOTAL 27*  --   --   --   TROPONINI  --  0.03 0.03 0.03   BNP (last 3 results)  Recent Labs  06/20/2015 2158  BNP 2279.0*    ProBNP (last 3 results) No results for input(s): PROBNP in the last 8760 hours.  CBG:  Recent Labs Lab 06/29/15 0728 06/29/15 1138 06/29/15 1634 06/29/15 2020 06/30/15 0724  GLUCAP 103* 138* 110* 90 79    Recent Results (from the past 240 hour(s))  Urine culture     Status: None (Preliminary result)   Collection Time: 06/29/15 11:30 AM  Result Value Ref Range Status    Specimen Description URINE, CATHETERIZED  Final   Special Requests NONE  Final   Culture   Final    NO GROWTH < 24 HOURS Performed at [facility]    Report Status PENDING  Incomplete     Studies: No results found.  Scheduled Meds: . calcium carbonate  1 tablet Oral Daily  . enoxaparin (LOVENOX) injection  30 mg Subcutaneous Q24H  . furosemide  20 mg Intravenous Q12H  . insulin aspart  0-9 Units Subcutaneous TID WC  . levothyroxine  75 mcg Intravenous Daily  . sodium chloride  3 mL Intravenous Q12H    Continuous Infusions:    Time spent: 35 minutes   [place] [facility] Pager [phone] . If 7PM-7AM, please contact night-coverage at [email or web address], password [facility] 06/30/2015, 4:42 PM  LOS: 2 days

## 2015-06-30 NOTE — Progress Notes (Signed)
Pt agitated, pulling at oxygen tubing and foley catheter, kicking at staff members. MD paged and orders given for one time dose of Ativan.

## 2015-07-01 ENCOUNTER — Inpatient Hospital Stay (HOSPITAL_COMMUNITY): Payer: Medicare Other

## 2015-07-01 DIAGNOSIS — J690 Pneumonitis due to inhalation of food and vomit: Secondary | ICD-10-CM | POA: Diagnosis present

## 2015-07-01 DIAGNOSIS — I509 Heart failure, unspecified: Secondary | ICD-10-CM

## 2015-07-01 DIAGNOSIS — R1314 Dysphagia, pharyngoesophageal phase: Secondary | ICD-10-CM | POA: Diagnosis present

## 2015-07-01 LAB — BASIC METABOLIC PANEL
ANION GAP: 14 (ref 5–15)
BUN: 20 mg/dL (ref 6–20)
CALCIUM: 8.6 mg/dL — AB (ref 8.9–10.3)
CO2: 33 mmol/L — AB (ref 22–32)
Chloride: 99 mmol/L — ABNORMAL LOW (ref 101–111)
Creatinine, Ser: 1.16 mg/dL — ABNORMAL HIGH (ref 0.44–1.00)
GFR, EST AFRICAN AMERICAN: 49 mL/min — AB (ref >60)
GFR, EST NON AFRICAN AMERICAN: 43 mL/min — AB (ref >60)
Glucose, Bld: 77 mg/dL (ref 65–99)
Potassium: 3.1 mmol/L — ABNORMAL LOW (ref 3.5–5.1)
Sodium: 146 mmol/L — ABNORMAL HIGH (ref 135–145)

## 2015-07-01 LAB — URINE CULTURE: Culture: NO GROWTH

## 2015-07-01 LAB — FOLATE RBC
FOLATE, RBC: 1175 ng/mL (ref >498)
Folate, Hemolysate: 343 ng/mL
HEMATOCRIT: 29.2 % — AB (ref 34.0–46.6)

## 2015-07-01 LAB — PROCALCITONIN: PROCALCITONIN: 0.52 ng/mL

## 2015-07-01 LAB — GLUCOSE, CAPILLARY
GLUCOSE-CAPILLARY: 75 mg/dL (ref 65–99)
GLUCOSE-CAPILLARY: 96 mg/dL (ref 65–99)
Glucose-Capillary: 69 mg/dL (ref 65–99)

## 2015-07-01 LAB — OCCULT BLOOD, POC DEVICE: Fecal Occult Bld: NEGATIVE

## 2015-07-01 LAB — HEMOGLOBIN A1C
HEMOGLOBIN A1C: 6.5 % — AB (ref 4.8–5.6)
MEAN PLASMA GLUCOSE: 140 mg/dL

## 2015-07-01 MED ORDER — LORAZEPAM 2 MG/ML IJ SOLN
1.0000 mg | Freq: Once | INTRAMUSCULAR | Status: AC
  Administered 2015-07-01: 1 mg via INTRAVENOUS
  Filled 2015-07-01: qty 1

## 2015-07-01 MED ORDER — PIPERACILLIN-TAZOBACTAM 3.375 G IVPB
3.3750 g | Freq: Three times a day (TID) | INTRAVENOUS | Status: DC
  Administered 2015-07-01 – 2015-07-03 (×5): 3.375 g via INTRAVENOUS
  Filled 2015-07-01 (×2): qty 50

## 2015-07-01 MED ORDER — DEXTROSE 50 % IV SOLN
1.0000 | Freq: Once | INTRAVENOUS | Status: AC
  Administered 2015-07-01: 50 mL via INTRAVENOUS

## 2015-07-01 MED ORDER — DEXTROSE 50 % IV SOLN
INTRAVENOUS | Status: AC
  Filled 2015-07-01: qty 50

## 2015-07-01 MED ORDER — METOPROLOL TARTRATE 1 MG/ML IV SOLN
2.5000 mg | Freq: Four times a day (QID) | INTRAVENOUS | Status: DC
  Administered 2015-07-01 – 2015-07-03 (×6): 2.5 mg via INTRAVENOUS
  Filled 2015-07-01 (×7): qty 5

## 2015-07-01 NOTE — Progress Notes (Signed)
PT Cancellation Note  Patient Details Name: AYVERIE FAHR MRN: [id number] DOB: February 26, 1931   Cancelled Treatment:    Reason Eval/Treat Not Completed: Other (comment). Pt is being followed by hospice for end of life care now. No PT services appropriate at this time. Will sign off.    Kion Huntsberry C 07/01/2015, 1:47 PM  1:47 PM  Etta Grandchild, PT, DPT [place] License # [id number]

## 2015-07-01 NOTE — Progress Notes (Signed)
Patient has been confused for sometime now she is on a high flow 12 lpm [place]. She presently has cannula in mouth because she is confused and fighting when aroused. Have been unable to obtain Oxygen saturation.

## 2015-07-01 NOTE — Clinical Documentation Improvement (Signed)
Hospitalist   (Query responses must be documented in the progress notes and discharge summary, not on the CDI BPA.)  Possible Clinical Conditions:  - Acute Hypoxic Respiratory Failure, including any associated condition(s) or cause(s).  - Other condition  - Unable to clinically determine  Clinical Information/Indicators: 02 sats in the 70's on 06/29/15. Patient requiring high flow oxygen by nasal cannula Respiratory Rates in the 20's   Please exercise your independent, professional judgment when responding. A specific answer is not anticipated or expected.   Thank You, Erling Conte  RN BSN [place]

## 2015-07-01 NOTE — Progress Notes (Signed)
Patients heart rate running between 120-135. SBP 90-115. Dr. [place] Pink notified via text page.

## 2015-07-01 NOTE — Progress Notes (Signed)
OT Cancellation Note  Patient Details Name: DESTINAE SAR MRN: [id number] DOB: 01-27-1931   Cancelled Treatment:    Reason Eval/Treat Not Completed: Other (comment) Pt is being followed by hospice for end of life care now. No OT services appropriate at this time. Will sign off.   Ailene Ravel, OTR/L,CBIS  [phone]  07/01/2015, 3:09 PM

## 2015-07-01 NOTE — Evaluation (Signed)
Clinical/Bedside Swallow Evaluation Patient Details  Name: Kathy Robbins MRN: [id number] Date of Birth: 06-13-1931  Today's Date: 07/01/2015 Time: SLP Start Time (ACUTE ONLY): [id number] SLP Stop Time (ACUTE ONLY): 1705 SLP Time Calculation (min) (ACUTE ONLY): 1415 min  Past Medical History:  Past Medical History  Diagnosis Date  . HTN (hypertension)   . History of colon cancer   . Atrial fibrillation or flutter   . Hyperlipidemia   . Thyroid disease     hypothyroidism  . Diabetes mellitus     NIDDM  . Osteopenia   . Arthritis   . Cancer [facility]) July 2013    Throat   Past Surgical History:  Past Surgical History  Procedure Laterality Date  . 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 Lymph node biopsy      removal  . Colonoscopy  Jan. 2013    6 polyps  removed   HPI:  Patient is an 82 year old female past medical history of chronic atrial fibrillation on anticoagulation, diabetes mellitus and hypothyroidism and previous alcoholic who lives alone and was brought in by police to the emergency room on 1/13 after being involuntarily committed by social work for self neglect and inability to care for herself. Social worker had been to patient's house earlier that day for welfare check and found conditions unsafe for patient. Patient reportedly had been eating rotted food and garbage out to her residence. She had been reportedly at [facility] week prior, but left before receiving treatment. According to patient's son in the emergency room, patient has refused help from family for many years and that the family has been asking for social work intervention for some time now. Patient not really interacting or answering questions to nurses or physicians. CT scan of head unremarkable. EKG noted atrial fibrillation and bradycardia. Initial labs noted mild acute kidney injury with creatinine 1.27 (was normal in August of last year) , but BNP noted to be 2279. Lactic  acid level within normal range. TSH at 11.72.   Assessment / Plan / Recommendation Clinical Impression  Pt assessed at bedside with family present; she was resistant to oral care and was unable to follow commands for oral motor examination. Pt edentulous and xerostomia noted. Decreased labial closure and lingual movement noted with ice chips, however as trials progressed she became more actively involved with attempts to self feed. Suspect delay in swallow initiation with seemingly weak hyolaryngeal excursion resulting in delayed and eventually immediate throat clear and coughing with thin and nectars. Pt tolerated 4 oz apple sauce and made further attempts at self feeding (eating mitts on hands). Pt easily redirected to po with SLP guidance. SLP explained risks for aspiration with thin and nectars at this time to family and they were appreciative of education regarding swallow function. Recommend D1/puree with honey-thick liquids with 100% feeder assist. SLP will follow while in acute setting. Above to RN and MD.     Aspiration Risk  Severe aspiration risk;Moderate aspiration risk    Diet Recommendation Dysphagia 1 (Puree);Honey-thick liquid   Liquid Administration via: Cup;Spoon Medication Administration: Crushed with puree Supervision: Full supervision/cueing for compensatory strategies;Staff to assist with self feeding Compensations: Slow rate;Small sips/bites Postural Changes: Seated upright at 90 degrees;Remain upright for at least 30 minutes after po intake    Other  Recommendations Oral Care Recommendations: Oral care BID;Staff/trained caregiver to provide oral care Other Recommendations: Order thickener from pharmacy;Clarify dietary restrictions  Follow up Recommendations  24 hour supervision/assistance;Skilled Nursing facility    Frequency and Duration min 2x/week  1 week       Prognosis Prognosis for Safe Diet Advancement: Fair Barriers to Reach Goals: Cognitive  deficits;Behavior      Swallow Study   General Date of Onset: 07/11/2015 HPI: Patient is an 82 year old female past medical history of chronic atrial fibrillation on anticoagulation, diabetes mellitus and hypothyroidism and previous alcoholic who lives alone and was brought in by police to the emergency room on 1/13 after being involuntarily committed by social work for self neglect and inability to care for herself. Social worker had been to patient's house earlier that day for welfare check and found conditions unsafe for patient. Patient reportedly had been eating rotted food and garbage out to her residence. She had been reportedly at [facility] week prior, but left before receiving treatment. According to patient's son in the emergency room, patient has refused help from family for many years and that the family has been asking for social work intervention for some time now. Patient not really interacting or answering questions to nurses or physicians. CT scan of head unremarkable. EKG noted atrial fibrillation and bradycardia. Initial labs noted mild acute kidney injury with creatinine 1.27 (was normal in August of last year) , but BNP noted to be 2279. Lactic acid level within normal range. TSH at 11.72. Type of Study: Bedside Swallow Evaluation Previous Swallow Assessment: None on record Diet Prior to this Study: NPO Temperature Spikes Noted: No Respiratory Status: Nasal cannula History of Recent Intubation: No Behavior/Cognition: Alert;Confused;Agitated Oral Cavity Assessment: Dry Oral Care Completed by SLP: No (attempted, but pt resistant) Oral Cavity - Dentition: Edentulous Vision: Impaired for self-feeding Self-Feeding Abilities: Needs assist Patient Positioning: Upright in bed Baseline Vocal Quality: Normal;Low vocal intensity Volitional Cough: Weak Volitional Swallow: Unable to elicit    Oral/Motor/Sensory Function Overall Oral Motor/Sensory Function: Generalized oral  weakness   Ice Chips Ice chips: Impaired Presentation: Spoon Oral Phase Impairments: Reduced labial seal;Reduced lingual movement/coordination;Poor awareness of bolus   Thin Liquid Thin Liquid: Impaired Presentation: Cup;Straw;Spoon Oral Phase Impairments: Reduced labial seal;Reduced lingual movement/coordination Pharyngeal  Phase Impairments: Suspected delayed Swallow;Decreased hyoid-laryngeal movement;Cough - Delayed    Nectar Thick Nectar Thick Liquid: Impaired Presentation: Spoon Oral Phase Impairments: Reduced lingual movement/coordination Pharyngeal Phase Impairments: Suspected delayed Swallow;Decreased hyoid-laryngeal movement;Multiple swallows;Wet Vocal Quality;Throat Clearing - Delayed;Cough - Delayed   Honey Thick Honey Thick Liquid: Within functional limits Presentation: Cup;Spoon   Puree Puree: Within functional limits Presentation: Spoon   Solid   Thank you,  Genene Churn, CCC-SLP [phone]    Solid: Not tested        Derell Bruun 07/01/2015,6:49 PM
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
## 2015-07-01 NOTE — Progress Notes (Signed)
Patients blood sugar 69 and she is NPO. Dr. Fredrich Birks notified. Will give 1 amp of D50 at this time.
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
## 2015-07-01 NOTE — Progress Notes (Signed)
Patient given IV lopressor as ordered for HR in 130's. Heart rate down to 110's at this time.

## 2015-07-01 NOTE — Progress Notes (Signed)
ANTIBIOTIC CONSULT NOTE - INITIAL  Pharmacy Consult for Zosyn Indication: pneumonia / aspiration  Allergies  Allergen Reactions  . Iohexol      Code: HIVES, Desc: AFTER CT PT HAD HIVES,SOME SOB TREATED BY MD [facility] NEEDS PRE.MEDS.NEXT TIME, Onset Date: [id number]     Patient Measurements: Height: 5\' 3"  (160 cm) Weight: 132 lb 15.7 oz (60.32 kg) IBW/kg (Calculated) : 52.4  Vital Signs: Temp: 98.2 F (36.8 C) (01/16 1506) Temp Source: Axillary (01/16 1506) BP: 119/53 mmHg (01/16 1817) Pulse Rate: 109 (01/16 1506) Intake/Output from previous day: 01/15 0701 - 01/16 0700 In: 0  Out: 2125 [Urine:2125] Intake/Output from this shift: Total I/O In: -  Out: 100 [Urine:100]  Labs:  Recent Labs  06/29/15 0421 07/01/15 0634  WBC 5.5  --   HGB 8.0*  --   PLT 355  --   CREATININE 1.27* 1.16*   Estimated Creatinine Clearance: 30.9 mL/min (by C-G formula based on Cr of 1.16). No results for input(s): VANCOTROUGH, VANCOPEAK, VANCORANDOM, GENTTROUGH, GENTPEAK, GENTRANDOM, TOBRATROUGH, TOBRAPEAK, TOBRARND, AMIKACINPEAK, AMIKACINTROU, AMIKACIN in the last 72 hours.   Microbiology: Recent Results (from the past 720 hour(s))  Urine culture     Status: None   Collection Time: 06/29/15 11:30 AM  Result Value Ref Range Status   Specimen Description URINE, CATHETERIZED  Final   Special Requests NONE  Final   Culture   Final    NO GROWTH 2 DAYS Performed at [facility]    Report Status 07/01/2015 FINAL  Final   Medical History: Past Medical History  Diagnosis Date  . HTN (hypertension)   . History of colon cancer   . Atrial fibrillation or flutter   . Hyperlipidemia   . Thyroid disease     hypothyroidism  . Diabetes mellitus     NIDDM  . Osteopenia   . Arthritis   . Cancer [facility]) July 2013    Throat   Anti-infectives    Start     Dose/Rate Route Frequency Ordered Stop   07/01/15 2000  piperacillin-tazobactam (ZOSYN) IVPB 3.375 g     3.375 g 12.5 mL/hr  over 240 Minutes Intravenous Every 8 hours 07/01/15 1849       Assessment: 82yo female with suspected aspiration pna.  Asked to initiate Zosyn.  Goal of Therapy:  Eradicate infection.  Plan:  Zosyn 3.375gm IV q8h, EID Monitor labs, progress and c/s  Hart Robinsons A 07/01/2015,6:50 PM

## 2015-07-02 DIAGNOSIS — D509 Iron deficiency anemia, unspecified: Secondary | ICD-10-CM | POA: Diagnosis present

## 2015-07-02 DIAGNOSIS — I5031 Acute diastolic (congestive) heart failure: Secondary | ICD-10-CM

## 2015-07-02 DIAGNOSIS — E870 Hyperosmolality and hypernatremia: Secondary | ICD-10-CM | POA: Diagnosis not present

## 2015-07-02 DIAGNOSIS — J9601 Acute respiratory failure with hypoxia: Secondary | ICD-10-CM | POA: Diagnosis present

## 2015-07-02 LAB — BASIC METABOLIC PANEL
ANION GAP: 13 (ref 5–15)
ANION GAP: 14 (ref 5–15)
BUN: 23 mg/dL — ABNORMAL HIGH (ref 6–20)
BUN: 25 mg/dL — ABNORMAL HIGH (ref 6–20)
CHLORIDE: 99 mmol/L — AB (ref 101–111)
CO2: 36 mmol/L — AB (ref 22–32)
CO2: 35 mmol/L — ABNORMAL HIGH (ref 22–32)
Calcium: 8.6 mg/dL — ABNORMAL LOW (ref 8.9–10.3)
Calcium: 8.5 mg/dL — ABNORMAL LOW (ref 8.9–10.3)
Chloride: 99 mmol/L — ABNORMAL LOW (ref 101–111)
Creatinine, Ser: 1.31 mg/dL — ABNORMAL HIGH (ref 0.44–1.00)
Creatinine, Ser: 1.46 mg/dL — ABNORMAL HIGH (ref 0.44–1.00)
GFR calc Af Amer: 43 mL/min — ABNORMAL LOW (ref >60)
GFR calc Af Amer: 37 mL/min — ABNORMAL LOW (ref >60)
GFR, EST NON AFRICAN AMERICAN: 37 mL/min — AB (ref >60)
GFR, EST NON AFRICAN AMERICAN: 32 mL/min — AB (ref >60)
GLUCOSE: 94 mg/dL (ref 65–99)
GLUCOSE: 117 mg/dL — AB (ref 65–99)
POTASSIUM: 3 mmol/L — AB (ref 3.5–5.1)
POTASSIUM: 3.1 mmol/L — AB (ref 3.5–5.1)
Sodium: 148 mmol/L — ABNORMAL HIGH (ref 135–145)
Sodium: 148 mmol/L — ABNORMAL HIGH (ref 135–145)

## 2015-07-02 LAB — CBC
HEMATOCRIT: 33 % — AB (ref 36.0–46.0)
HEMOGLOBIN: 9.5 g/dL — AB (ref 12.0–15.0)
MCH: 22.1 pg — AB (ref 26.0–34.0)
MCHC: 28.8 g/dL — AB (ref 30.0–36.0)
MCV: 76.7 fL — ABNORMAL LOW (ref 78.0–100.0)
Platelets: 326 10*3/uL (ref 150–400)
RBC: 4.3 MIL/uL (ref 3.87–5.11)
RDW: 24.4 % — ABNORMAL HIGH (ref 11.5–15.5)
WBC: 5.8 10*3/uL (ref 4.0–10.5)

## 2015-07-02 LAB — GLUCOSE, CAPILLARY
GLUCOSE-CAPILLARY: 105 mg/dL — AB (ref 65–99)
Glucose-Capillary: 80 mg/dL (ref 65–99)
Glucose-Capillary: 87 mg/dL (ref 65–99)
Glucose-Capillary: 114 mg/dL — ABNORMAL HIGH (ref 65–99)

## 2015-07-02 MED ORDER — POTASSIUM CHLORIDE IN NACL 40-0.9 MEQ/L-% IV SOLN
INTRAVENOUS | Status: DC
  Administered 2015-07-02: 50 mL/h via INTRAVENOUS

## 2015-07-02 NOTE — Progress Notes (Signed)
PROGRESS NOTE  Kathy Robbins FKC:[id number] DOB: November 27, 1930 DOA: 07/08/2015 PCP: Wardell Honour, MD  HPI/Recap of past 24 hours: Patient is an 82 year old female past medical history of chronic atrial fibrillation on anticoagulation, diabetes mellitus and hypothyroidism and previous alcoholic who lives alone and was brought in by police to the emergency room on 1/13 after being involuntarily committed by social work for self neglect and inability to care for herself. Social worker had been to patient's house earlier that day for welfare check and found conditions unsafe for patient. Patient reportedly had been eating rotted food and garbage out to her residence. She had been reportedly at [facility] week prior, but left before receiving treatment. According to patient's son in the emergency room, patient has refused help from family for many years and that the family has been asking for social work intervention for some time now. Patient not really interacting or answering questions to nurses or physicians. CT scan of head unremarkable. EKG noted atrial fibrillation and bradycardia. Initial labs noted mild acute kidney injury with creatinine 1.27 (was normal in August of last year) , but BNP noted to be 2279. Lactic acid level within normal range. TSH at 11.72.  Patient initially started on IV fluids, but by afternoon of 1/14,oxygen saturations noted to be in the 70s. And she was requiring up to 5 L of oxygen, . Patient noted to be sounding very wet. Started on IV diuresis and Foley placed. Previous echo noted? Diastolic dysfunction, which repeat echo done 1/16 showed same. Patient has now diuresed almost 5 L.   Initial Pro calcitonin normal, but repeat now slightly elevated. Seen by speech therapy and found to have some signs of dysphagia, and they're recommending dysphagia 1 with honey thick liquids. Patient now felt to have a mild aspiration pneumonia and started on IV Zosyn as of 1/16.    Patient's mentation has improved slightly. She at times still gets agitated, refusing labs or taking by mouth medications refusing to take by mouth medications. Formal family meeting was done this afternoon 1/16 and family after discussion agreed the patient should be DO NOT RESUSCITATE. They're waiting to hear results of swallow evaluation as well as pending repeat CT scan of the head before deciding on hospice versus skilled nursing placement  Patient herself still moaning. Not really able to answer questions appropriately for me  Assessment/Plan: Principal Problem:   Acute respiratory failure with hypoxia secondary to Acute on chronic  diastolic heart failure [facility]): Echocardiogram notes question with diastolic dysfunction, unable to fully ascertain due to A. fib continued. Little change in her diuresis output from yesterday, so have stopped Lasix especially in the setting of worsening hypernatremia. Stopped Lasix and started half-normal saline at 75 cc an hour    Hypertension: As above    Hypothyroidism: Suspect patient has not been on her Synthroid for some time and is may account for some of her confusion. Have started IV Synthroid    Type 2 diabetes mellitus ([place]): A1c at 6.5, expect CBGs to rise now that she'll be allowed to take by mouth    Atrial fibrillation : Rate controlled. On Lovenox. Chads 2 score of 6, on IV Lopressor    Carotid stenosis    Acute Encephalopathy in the setting of chronic dementia: Since patient cannot lay still for MRI, checked repeat CT which noted no signs of CVA. Suspect all of this is just her worsening dementia, with acute behavioral disturbance secondary to hospitalization and acute illness  Protein calorie malnutrition ([place]): Being followed by nutrition   Dysphagia: Evaluated by speech therapy who put patient on dysphagia 1 diet with honey thick liquids.  Presumed aspiration pneumonia: Given increase in Pro calcitonin has started presumptive IV  Zosyn on 1/16    Bradycardia: resolved.    Chronic anticoagulation: Currently on Lovenox. When she is able to resume by mouth, we'll restart xarelto  Hypokalemia: Severe dehydration, poor nutrition and diuresis. Replacing in her IV fluids    Code Status: Full code Changed to DO NOT RESUSCITATE by family  Family Communication: Met with multiple family members today and formal meetingOn 1/16  Disposition Plan:  Now that CT scan head confirms no new CVA and swallow eval notes dysphagia, but able to take some by mouth, we'll plan for skilled nursing facility. Family will need to petition court to assume guardianship from state, although state feels that this will be easy to do. Discharge once sodium improved, patient taking by mouth   Consultants:  None   Procedures:  Echocardiogram done 1/16: Question without talked dysfunction, unable to ascertain due to A. fib   Antibiotics:IV Levaquin 1/16-present    Objective: BP 109/51 mmHg  Pulse 108  Temp(Src) 97.8 F (36.6 C) (Axillary)  Resp 18  Ht 5' 3"  (1.6 m)  Wt 55.248 kg (121 lb 12.8 oz)  BMI 21.58 kg/m2  SpO2 100%  Intake/Output Summary (Last 24 hours) at 07/02/15 1707 Last data filed at 07/01/15 1850  Gross per 24 hour  Intake      0 ml  Output    400 ml  Net   -400 ml   Filed Weights   06/30/15 0550 07/01/15 0653 07/02/15 0815  Weight: 60.555 kg (133 lb 8 oz) 60.32 kg (132 lb 15.7 oz) 55.248 kg (121 lb 12.8 oz)    Exam: Little change from previous day   General:  Confused, delirious   Cardiovascular: irregular rhythm, borderline tachycardia  Respiratory: Mostly clear to auscultation bilaterally  Abdomen: soft, nontender, nondistended, positive bowel sounds   Musculoskeletal: 1+ pitting edema bilaterally    Data Reviewed: Basic Metabolic Panel:  Recent Labs Lab 06/26/2015 1730 06/18/2015 2158 06/29/15 0421 07/01/15 0634 07/02/15 0652 07/02/15 1508  NA 144  --  141 146* 148* 148*  K 3.1*  --  3.1*  3.1* 3.0* 3.1*  CL 105  --  105 99* 99* 99*  CO2 28  --  23 33* 36* 35*  GLUCOSE 118*  --  129* 77 94 117*  BUN 13  --  16 20 23* 25*  CREATININE 1.12*  --  1.27* 1.16* 1.31* 1.46*  CALCIUM 8.3*  --  8.0* 8.6* 8.6* 8.5*  MG  --  1.3*  --   --   --   --    Liver Function Tests:  Recent Labs Lab 06/19/2015 1730 06/29/15 0421  AST 19 19  ALT 10* 10*  ALKPHOS 92 88  BILITOT 0.6 0.8  PROT 6.1* 5.5*  ALBUMIN 2.7* 2.5*   No results for input(s): LIPASE, AMYLASE in the last 168 hours.  Recent Labs Lab 07/02/2015 2158  AMMONIA 9   CBC:  Recent Labs Lab 06/20/2015 1730 06/18/2015 2158 06/29/15 0421 07/02/15 0652  WBC 6.3  --  5.5 5.8  NEUTROABS 4.7  --   --   --   HGB 8.8*  --  8.0* 9.5*  HCT 30.4* 29.2* 28.6* 33.0*  MCV 76.0*  --  76.9* 76.7*  PLT 342  --  355  326   Cardiac Enzymes:    Recent Labs Lab 07/07/2015 1730 07/04/2015 2158 06/29/15 0421 06/29/15 0905  CKTOTAL 27*  --   --   --   TROPONINI  --  0.03 0.03 0.03   BNP (last 3 results)  Recent Labs  06/21/2015 2158  BNP 2279.0*    ProBNP (last 3 results) No results for input(s): PROBNP in the last 8760 hours.  CBG:  Recent Labs Lab 07/01/15 0721 07/01/15 1135 07/01/15 2030 07/02/15 0747 07/02/15 1138  GLUCAP 75 69 96 80 87    Recent Results (from the past 240 hour(s))  Urine culture     Status: None   Collection Time: 06/29/15 11:30 AM  Result Value Ref Range Status   Specimen Description URINE, CATHETERIZED  Final   Special Requests NONE  Final   Culture   Final    NO GROWTH 2 DAYS Performed at [facility]    Report Status 07/01/2015 FINAL  Final     Studies: Ct Head Wo Contrast  07/01/2015  CLINICAL DATA:  Mental status change. EXAM: CT HEAD WITHOUT CONTRAST TECHNIQUE: Contiguous axial images were obtained from the base of the skull through the vertex without intravenous contrast. COMPARISON:  06/27/2015 FINDINGS: Brain: No evidence of acute infarction, hemorrhage, extra-axial  collection, ventriculomegaly, or mass effect. There is stable moderate brain parenchymal atrophy and chronic small vessel disease changes. Old right basal ganglia lacunar infarcts are also stable. Vascular: No hyperdense vessel or unexpected calcification. Skull: Negative for fracture or focal lesion. Sinuses/Orbits: Stable polypoid mucosal thickening of the right maxillary sinus, otherwise no acute findings. Other: None. IMPRESSION: No acute intracranial abnormality. Moderate brain parenchymal atrophy and chronic findings of microvascular disease. Electronically Signed   By: Fidela Salisbury M.D.   On: 07/01/2015 19:36    Scheduled Meds: . calcium carbonate  1 tablet Oral Daily  . enoxaparin (LOVENOX) injection  30 mg Subcutaneous Q24H  . insulin aspart  0-9 Units Subcutaneous TID WC  . levothyroxine  75 mcg Intravenous Daily  . metoprolol  2.5 mg Intravenous 4 times per day  . piperacillin-tazobactam (ZOSYN)  IV  3.375 g Intravenous Q8H  . sodium chloride  3 mL Intravenous Q12H    Continuous Infusions: . 0.9 % NaCl with KCl 40 mEq / L 50 mL/hr (07/02/15 1020)     Time spent: 25 minutes   [facility] Pager [phone] . If 7PM-7AM, please contact night-coverage at [email or web address], password [facility] 07/02/2015, 5:07 PM  LOS: 4 days

## 2015-07-02 NOTE — NC FL2 (Deleted)
[place] LEVEL OF CARE SCREENING TOOL     IDENTIFICATION  Patient Name: Kathy Robbins Birthdate: 1930-09-25 Sex: female Admission Date (Current Location): 06/18/2015  [facility] and [place] Number:  [employer] and Address:  [place] [address] [place]      Provider Number: [id number]  Attending Physician Name and Address:  Annita Brod, MD  Relative Name and Phone Number:       Current Level of Care: Hospital Recommended Level of Care: [place] Prior Approval Number:    Date Approved/Denied:   PASRR Number:    Discharge Plan: SNF    Current Diagnoses: Patient Active Problem List   Diagnosis Date Noted  . Microcytic anemia 07/02/2015  . Acute respiratory failure with hypoxia ([place]) 07/02/2015  . Hypernatremia 07/02/2015  . Aspiration pneumonia ([place]) 07/01/2015  . Dysphagia, pharyngoesophageal phase 07/01/2015  . Bradycardia 06/29/2015  . Acute diastolic heart failure ([place]) 06/29/2015  . Chronic anticoagulation 06/29/2015  . Aphasia 06/18/2015  . Encephalopathy 07/04/2015  . Alcoholism in recovery ([place]) 07/15/2015  . Chronic atrial fibrillation ([place])   . Protein calorie malnutrition ([place])   . Hypokalemia   . Carotid stenosis 01/15/2012  . Hypertension 05/12/2011  . Hypothyroidism 05/12/2011  . Hyperlipidemia 05/12/2011  . Type 2 diabetes mellitus ([place]) 05/12/2011  . Atrial fibrillation  05/12/2011    Orientation RESPIRATION BLADDER Height & Weight       O2 (10L) Incontinent 5\' 3"  (160 cm) 121 lbs.  BEHAVIORAL SYMPTOMS/MOOD NEUROLOGICAL BOWEL NUTRITION STATUS      Incontinent Diet (DIET DYS 1: fluid consistency: Honey Thick. Aspiration precautions, feeder assist. PO meds crushed as able in puree.  )  AMBULATORY STATUS COMMUNICATION OF NEEDS Skin   Extensive Assist  (At baseline patient can talk. Since being admitted patient has made incomprehensible noises only. ) Normal                        Personal Care Assistance Level of Assistance  Total care       Total Care Assistance: Maximum assistance   Functional Limitations Info  Sight, Hearing, Speech Sight Info: Impaired Hearing Info: Adequate Speech Info: Impaired    SPECIAL CARE FACTORS FREQUENCY  Speech therapy             Speech Therapy Frequency:  (3x/week)      Contractures      Additional Factors Info  Code Status, Allergies, Psychotropic, Insulin Sliding Scale (DNR) Code Status Info:  (DNR) Allergies Info:  (Iohexol) Psychotropic Info:  (Ativan) Insulin Sliding Scale Info:  (3x/day)       Current Medications (07/02/2015):  This is the current hospital active medication list Current Facility-Administered Medications  Medication Dose Route Frequency Provider Last Rate Last Dose  . 0.9 % NaCl with KCl 40 mEq / L  infusion   Intravenous Continuous Annita Brod, MD      . acetaminophen (TYLENOL) tablet 650 mg  650 mg Oral Q6H PRN Vianne Bulls, MD       Or  . acetaminophen (TYLENOL) suppository 650 mg  650 mg Rectal Q6H PRN Vianne Bulls, MD      . calcium carbonate (OS-CAL - dosed in mg of elemental calcium) tablet 500 mg of elemental calcium  1 tablet Oral Daily Vianne Bulls, MD   500 mg of elemental calcium at 06/29/15 1000  . enoxaparin (LOVENOX) injection 30 mg  30 mg Subcutaneous Q24H Annita Brod, MD   30 mg at 07/01/15 1550  . insulin aspart (novoLOG) injection 0-9 Units  0-9 Units Subcutaneous TID WC Vianne Bulls, MD   1 Units at 06/29/15 1155  . levothyroxine (SYNTHROID, LEVOTHROID) injection 75 mcg  75 mcg Intravenous Daily Annita Brod, MD   75 mcg at 07/01/15 1033  . LORazepam (ATIVAN) injection 0.5 mg  0.5 mg Intravenous Q6H PRN Annita Brod, MD   0.5 mg at 07/02/15 0645  . metoprolol (LOPRESSOR) injection 2.5 mg  2.5 mg Intravenous 4 times per day Annita Brod, MD   2.5 mg at 07/02/15 0644  . ondansetron (ZOFRAN) tablet 4 mg  4 mg Oral Q6H  PRN Vianne Bulls, MD       Or  . ondansetron (ZOFRAN) injection 4 mg  4 mg Intravenous Q6H PRN Ilene Qua Opyd, MD      . piperacillin-tazobactam (ZOSYN) IVPB 3.375 g  3.375 g Intravenous Q8H Annita Brod, MD   3.375 g at 07/02/15 0459  . senna-docusate (Senokot-S) tablet 1 tablet  1 tablet Oral QHS PRN Ilene Qua Opyd, MD      . sodium chloride 0.9 % injection 3 mL  3 mL Intravenous Q12H Vianne Bulls, MD   3 mL at 07/01/15 2111  . sodium phosphate (FLEET) 7-19 GM/118ML enema 1 enema  1 enema Rectal Once PRN Vianne Bulls, MD         Discharge Medications: Please see discharge summary for a list of discharge medications.  Relevant Imaging Results:  Relevant Lab Results:   Additional Information    Jmari Pelc, Clydene Pugh, LCSW

## 2015-07-02 NOTE — Progress Notes (Signed)
Speech Language Pathology Treatment: Dysphagia  Patient Details Name: Kathy Robbins MRN: [id number] DOB: 01-15-1931 Today's Date: 07/02/2015 Time: [id number] SLP Time Calculation (min) (ACUTE ONLY): 18 min  Assessment / Plan / Recommendation Clinical Impression  Attempted to see pt for diet tolerance. Pt lethargic, keeps eyes closed, moans periodically. SLP repositioned pt upright and completed oral care. Pt with open mouth posture, no gag reflex; thick mucous removed from faucial pillars and pt continues with no response due to lethargy. No response to sternal rub or ice chip along lower lip. No po trials given at this time due to lethargy. Sitter reports lethargy all morning, but did take some HTL orange juice after oral care. SLP encouraged sitter to only feed pt when alert and willing to take po. Suspect pt will become more alert later in the day and can try then. Continue puree/HTL if/when alert.   HPI HPI: Patient is an 82 year old female past medical history of chronic atrial fibrillation on anticoagulation, diabetes mellitus and hypothyroidism and previous alcoholic who lives alone and was brought in by police to the emergency room on 1/13 after being involuntarily committed by social work for self neglect and inability to care for herself. Social worker had been to patient's house earlier that day for welfare check and found conditions unsafe for patient. Patient reportedly had been eating rotted food and garbage out to her residence. She had been reportedly at [facility] week prior, but left before receiving treatment. According to patient's son in the emergency room, patient has refused help from family for many years and that the family has been asking for social work intervention for some time now. Patient not really interacting or answering questions to nurses or physicians. CT scan of head unremarkable. EKG noted atrial fibrillation and bradycardia. Initial labs noted mild acute  kidney injury with creatinine 1.27 (was normal in August of last year) , but BNP noted to be 2279. Lactic acid level within normal range. TSH at 11.72.      SLP Plan  Continue with current plan of care     Recommendations  Diet recommendations: Dysphagia 1 (puree);Honey-thick liquid Liquids provided via: Teaspoon;Cup Medication Administration: Crushed with puree Supervision: Staff to assist with self feeding;Full supervision/cueing for compensatory strategies Compensations: Slow rate;Small sips/bites Postural Changes and/or Swallow Maneuvers: Seated upright 90 degrees;Upright 30-60 min after meal             Oral Care Recommendations: Oral care BID;Staff/trained caregiver to provide oral care Follow up Recommendations: 24 hour supervision/assistance;Skilled Nursing facility Plan: Continue with current plan of care     Thank you,  Genene Churn, [place]                 [place] 07/02/2015, 12:07 PM

## 2015-07-03 ENCOUNTER — Inpatient Hospital Stay (HOSPITAL_COMMUNITY): Payer: Medicare Other

## 2015-07-03 DIAGNOSIS — A419 Sepsis, unspecified organism: Secondary | ICD-10-CM

## 2015-07-03 DIAGNOSIS — J690 Pneumonitis due to inhalation of food and vomit: Secondary | ICD-10-CM

## 2015-07-03 DIAGNOSIS — J9601 Acute respiratory failure with hypoxia: Secondary | ICD-10-CM

## 2015-07-03 LAB — BLOOD GAS, ARTERIAL
Acid-base deficit: 0.2 mmol/L (ref 0.0–2.0)
BICARBONATE: 24.7 meq/L — AB (ref 20.0–24.0)
Drawn by: 234301
O2 Content: 10 L/min
O2 SAT: 95.7 %
PCO2 ART: 31.1 mmHg — AB (ref 35.0–45.0)
PO2 ART: 89.7 mmHg (ref 80.0–100.0)
pH, Arterial: 7.481 — ABNORMAL HIGH (ref 7.350–7.450)

## 2015-07-03 LAB — BASIC METABOLIC PANEL
BUN: 31 mg/dL — ABNORMAL HIGH (ref 6–20)
CALCIUM: 8.4 mg/dL — AB (ref 8.9–10.3)
CO2: 28 mmol/L (ref 22–32)
Chloride: 101 mmol/L (ref 101–111)
Creatinine, Ser: 1.84 mg/dL — ABNORMAL HIGH (ref 0.44–1.00)
GFR calc non Af Amer: 24 mL/min — ABNORMAL LOW (ref >60)
GFR, EST AFRICAN AMERICAN: 28 mL/min — AB (ref >60)
GLUCOSE: 144 mg/dL — AB (ref 65–99)
Potassium: 5 mmol/L (ref 3.5–5.1)
Sodium: 150 mmol/L — ABNORMAL HIGH (ref 135–145)

## 2015-07-03 LAB — GLUCOSE, CAPILLARY
GLUCOSE-CAPILLARY: 64 mg/dL — AB (ref 65–99)
Glucose-Capillary: 108 mg/dL — ABNORMAL HIGH (ref 65–99)

## 2015-07-03 LAB — PROCALCITONIN: Procalcitonin: 0.66 ng/mL

## 2015-07-03 MED ORDER — ENOXAPARIN SODIUM 60 MG/0.6ML ~~LOC~~ SOLN
55.0000 mg | SUBCUTANEOUS | Status: DC
  Administered 2015-07-03: 55 mg via SUBCUTANEOUS
  Filled 2015-07-03: qty 0.6

## 2015-07-03 MED ORDER — PIPERACILLIN SOD-TAZOBACTAM SO 2.25 (2-0.25) G IV SOLR
2.2500 g | Freq: Four times a day (QID) | INTRAVENOUS | Status: DC
  Administered 2015-07-03: 2.25 g via INTRAVENOUS
  Filled 2015-07-03 (×5): qty 2.25

## 2015-07-03 MED ORDER — DEXTROSE 50 % IV SOLN
25.0000 mL | Freq: Once | INTRAVENOUS | Status: AC
  Administered 2015-07-03: 25 mL via INTRAVENOUS

## 2015-07-03 MED ORDER — ENOXAPARIN SODIUM 60 MG/0.6ML ~~LOC~~ SOLN: 60.0000 mg | SUBCUTANEOUS | Status: DC

## 2015-07-03 MED ORDER — DEXTROSE 50 % IV SOLN
INTRAVENOUS | Status: AC
  Filled 2015-07-03: qty 50

## 2015-07-03 MED ORDER — DEXTROSE 5 % IV SOLN
INTRAVENOUS | Status: DC
  Administered 2015-07-03: 10:00:00 via INTRAVENOUS

## 2015-07-04 ENCOUNTER — Other Ambulatory Visit: Payer: Self-pay | Admitting: Family Medicine

## 2015-07-04 NOTE — Progress Notes (Signed)
Representatives from [facility] here to collect the deceased.

## 2015-07-05 LAB — GLUCOSE, CAPILLARY: GLUCOSE-CAPILLARY: 103 mg/dL — AB (ref 65–99)

## 2015-07-17 NOTE — Clinical Social Work Note (Addendum)
CSW spoke with patient's son, Kathy Robbins, who provided some history.  He advised that CSW should speak to patient's son Kathy Robbins.  CSW spoke with Kathy Robbins who identified that they had chosen [facility] as the primary choice.  CSW encouraged Kathy Robbins to pick 2-3 additional facilities to in the event that the primary choice could not make a bed offer.  He was agreeable. CSW advised him that he could leave choices on CSW voicemail. He advised that he had spoken with [facility] at [place]. Kathy Robbins stated that Patriciaann Clan at [facility] will be working with the case ongoing.     Ihor Gully,   [phone]

## 2015-07-17 NOTE — Discharge Summary (Addendum)
Death Summary  TERREA KROME [id number] DOB: 1930-12-15 DOA: 07/13/2015  PCP: Wardell Honour, MD  Admit date: 07/13/15 Date of Death: 07/19/15  Final Diagnoses:  1. Acute respiratory failure with hypoxia 2. Presumed aspiration pneumonia unable to determine if present on admission 3. Suspected sepsis secondary to aspiration pneumonia 4. Acute on chronic diastolic congestive heart failure 5. Acute kidney injury 6. Acute encephalopathy  7. Hypothyroidism 8. Diabetes mellitus type 2 9. Failure to thrive 10. Atrial fibrillation with rapid ventricular response 11. Microcytic anemia 12. Dysphagia  History of present illness:  82 year old woman lived alone presented to the emergency department after social work completed involuntary commitment paperwork for self neglect and inability to care for herself. Patient was confused and not answering questions. Initial investigation suggested encephalopathy of unknown origin, self-neglect. According to family she had refused assistance for years.  Hospital Course:  She was admitted, treated with IV fluids for acute kidney injury but then developed acute hypoxic respiratory failure requiring high flow oxygen. Diastolic dysfunction was suspected and she history with Lasix. Throughout her hospitalization she had episodes of agitation and was refusing medications. Respiratory status somewhat improved with diuresis but she continued to require high flow oxygen. Mental status waxed and waned in investigation including CT head was unrevealing. She was found to have dysphagia and felt to have developed aspiration pneumonia and was started on antibiotics. After further discussion with family she was made DO NOT RESUSCITATE. Later in her hospitalization she developed profound hypotension, hypoxia and became obtunded. Her clinical condition suggested sepsis secondary to aspiration pneumonia. After further discussion with family members including son Diamante Smalls she was made full comfort care. Care was also discussed with son Ysidro Evert. She subsequently died 07/17/2022 at 2113-09-29.    Time: 15 minutes  Signed:  Murray Hodgkins, MD  [facility] 07-19-15, 5:56 PM

## 2015-07-17 NOTE — Progress Notes (Signed)
Unable to get BP on patient, O2 sat in 60's.  MD made aware and came to bedside.  Finger tips appear blue.  D50 given for hypoglycemia.  New orders placed.

## 2015-07-17 NOTE — Progress Notes (Signed)
ANTIBIOTIC CONSULT NOTE  Pharmacy Consult for Zosyn, lovenox Indication: pneumonia / aspiration/ afib  Allergies  Allergen Reactions  . Iohexol      Code: HIVES, Desc: AFTER CT PT HAD HIVES,SOME SOB TREATED BY MD [facility] NEEDS PRE.MEDS.NEXT TIME, Onset Date: [id number]     Patient Measurements: Height: 5\' 3"  (160 cm) Weight: 121 lb 12.8 oz (55.248 kg) IBW/kg (Calculated) : 52.4  Vital Signs: Temp: 97.6 F (36.4 C) (01/17 2217) Temp Source: Oral (01/17 2217) BP: 103/46 mmHg (01/17 2217) Pulse Rate: 47 (01/17 2217) Intake/Output from previous day: 01/17 0701 - 01/18 0700 In: 0  Out: 100 [Urine:100] Intake/Output from this shift:    Labs:  Recent Labs  07/02/15 0652 07/02/15 1508 16-Jul-2015 0700  WBC 5.8  --   --   HGB 9.5*  --   --   PLT 326  --   --   CREATININE 1.31* 1.46* 1.84*   Estimated Creatinine Clearance: 19.5 mL/min (by C-G formula based on Cr of 1.84). No results for input(s): VANCOTROUGH, VANCOPEAK, VANCORANDOM, GENTTROUGH, GENTPEAK, GENTRANDOM, TOBRATROUGH, TOBRAPEAK, TOBRARND, AMIKACINPEAK, AMIKACINTROU, AMIKACIN in the last 72 hours.   Microbiology: Recent Results (from the past 720 hour(s))  Urine culture     Status: None   Collection Time: 06/29/15 11:30 AM  Result Value Ref Range Status   Specimen Description URINE, CATHETERIZED  Final   Special Requests NONE  Final   Culture   Final    NO GROWTH 2 DAYS Performed at [facility]    Report Status 07/01/2015 FINAL  Final   Medical History: Past Medical History  Diagnosis Date  . HTN (hypertension)   . History of colon cancer   . Atrial fibrillation or flutter   . Hyperlipidemia   . Thyroid disease     hypothyroidism  . Diabetes mellitus     NIDDM  . Osteopenia   . Arthritis   . Cancer [facility]) July 2013    Throat   Anti-infectives    Start     Dose/Rate Route Frequency Ordered Stop   07/01/15 2000  piperacillin-tazobactam (ZOSYN) IVPB 3.375 g     3.375 g 12.5 mL/hr over  240 Minutes Intravenous Every 8 hours 07/01/15 1849       Assessment: 82yo female with suspected aspiration pna on zosyn.  Her SrCr has bumped up today, CrCl ~19 ml/min Lovenox was initiated 1/14 as patient unable to take po xarelto.  Will adjust lovenox to full dose for h/o afib.  Goal of Therapy:  Eradicate infection.  Plan:  Change Zosyn to 2.25 gm IV q6 hours Monitor labs, progress and c/s Lovenox 55 mg sq q24 hours  Jermika Olden [place] 16-Jul-2015,9:15 AM

## 2015-07-17 NOTE — Progress Notes (Signed)
Unable to pick up SPO2 due to pt fingers are cold and poor circulation

## 2015-07-17 NOTE — NC FL2 (Signed)
[place] LEVEL OF CARE SCREENING TOOL     IDENTIFICATION  Patient Name: Kathy Robbins Birthdate: [birth year] Sex: female Admission Date (Current Location): 07/04/2015  [facility] and [place] Number:  [employer] and Address:  [place] [address] [place]      Provider Number: [id number]  Attending Physician Name and Address:  Samuella Cota, MD  Relative Name and Phone Number:       Current Level of Care: Hospital Recommended Level of Care: [place] Prior Approval Number:    Date Approved/Denied:   PASRR Number:  ([id number] )  Discharge Plan: SNF    Current Diagnoses: Patient Active Problem List   Diagnosis Date Noted  . Microcytic anemia 07/02/2015  . Acute respiratory failure with hypoxia ([place]) 07/02/2015  . Hypernatremia 07/02/2015  . Aspiration pneumonia ([place]) 07/01/2015  . Dysphagia, pharyngoesophageal phase 07/01/2015  . Bradycardia 06/29/2015  . Acute diastolic heart failure ([place]) 06/29/2015  . Chronic anticoagulation 06/29/2015  . Aphasia 06/29/2015  . Dementia with behavioral disturbance 07/10/2015  . Alcoholism in recovery ([place]) 06/27/2015  . Chronic atrial fibrillation ([place])   . Protein calorie malnutrition ([place])   . Hypokalemia   . Carotid stenosis 01/15/2012  . Hypertension 05/12/2011  . Hypothyroidism 05/12/2011  . Hyperlipidemia 05/12/2011  . Type 2 diabetes mellitus ([place]) 05/12/2011  . Atrial fibrillation  05/12/2011    Orientation RESPIRATION BLADDER Height & Weight       O2 (10L) Incontinent 5\' 3"  (160 cm) 121 lbs.  BEHAVIORAL SYMPTOMS/MOOD NEUROLOGICAL BOWEL NUTRITION STATUS      Incontinent Diet (DIET DYS 1: fluid consistency: Honey Thick. Aspiration precautions, feeder assist. PO meds crushed as able in puree.  )  AMBULATORY STATUS COMMUNICATION OF NEEDS Skin   Extensive Assist  (At baseline patient can talk. Since being admitted patient has made  incomprehensible noises only. ) Normal                       Personal Care Assistance Level of Assistance  Total care       Total Care Assistance: Maximum assistance   Functional Limitations Info  Sight, Hearing, Speech Sight Info: Impaired Hearing Info: Adequate Speech Info: Impaired    SPECIAL CARE FACTORS FREQUENCY  Speech therapy             Speech Therapy Frequency:  (3x/week)      Contractures      Additional Factors Info  Code Status, Allergies, Psychotropic, Insulin Sliding Scale (DNR) Code Status Info:  (DNR) Allergies Info:  (Iohexol) Psychotropic Info:  (Ativan) Insulin Sliding Scale Info:  (3x/day)       Current Medications (August 01, 2015):  This is the current hospital active medication list Current Facility-Administered Medications  Medication Dose Route Frequency Provider Last Rate Last Dose  . acetaminophen (TYLENOL) tablet 650 mg  650 mg Oral Q6H PRN Vianne Bulls, MD       Or  . acetaminophen (TYLENOL) suppository 650 mg  650 mg Rectal Q6H PRN Vianne Bulls, MD      . calcium carbonate (OS-CAL - dosed in mg of elemental calcium) tablet 500 mg of elemental calcium  1 tablet Oral Daily Vianne Bulls, MD   500 mg of elemental calcium at 06/29/15 1000  . dextrose 5 % solution   Intravenous Continuous Samuella Cota, MD 50 mL/hr at 01-Aug-2015 1022    . enoxaparin (LOVENOX) injection 55 mg  55 mg Subcutaneous Q24H Samuella Cota, MD   55 mg at 07/16/2015 1022  . insulin aspart (novoLOG) injection 0-9 Units  0-9 Units Subcutaneous TID WC Vianne Bulls, MD   1 Units at 06/29/15 1155  . levothyroxine (SYNTHROID, LEVOTHROID) injection 75 mcg  75 mcg Intravenous Daily Annita Brod, MD   75 mcg at 07-16-15 1022  . LORazepam (ATIVAN) injection 0.5 mg  0.5 mg Intravenous Q6H PRN Annita Brod, MD   0.5 mg at 07/02/15 0645  . metoprolol (LOPRESSOR) injection 2.5 mg  2.5 mg Intravenous 4 times per day Annita Brod, MD   2.5 mg at 2015/07/16 0100   . ondansetron (ZOFRAN) tablet 4 mg  4 mg Oral Q6H PRN Vianne Bulls, MD       Or  . ondansetron (ZOFRAN) injection 4 mg  4 mg Intravenous Q6H PRN Ilene Qua Opyd, MD      . piperacillin-tazobactam (ZOSYN) 2.25 g in dextrose 5 % 50 mL IVPB  2.25 g Intravenous 4 times per day Samuella Cota, MD      . senna-docusate (Senokot-S) tablet 1 tablet  1 tablet Oral QHS PRN Vianne Bulls, MD      . sodium chloride 0.9 % injection 3 mL  3 mL Intravenous Q12H Vianne Bulls, MD   3 mL at 07/02/15 2155  . sodium phosphate (FLEET) 7-19 GM/118ML enema 1 enema  1 enema Rectal Once PRN Vianne Bulls, MD         Discharge Medications: Please see discharge summary for a list of discharge medications.  Relevant Imaging Results:  Relevant Lab Results:   Additional Information    Letty Salvi, Clydene Pugh, LCSW

## 2015-07-17 NOTE — Progress Notes (Signed)
PROGRESS NOTE  Kathy Robbins [id number] DOB: May 24, 1931 DOA: 07/07/2015 PCP: Wardell Honour, MD  Summary: 69 yof with a hx of atrial fibrillation, DM, and hypothyroidism presented to the ED after being involuntarily committed by social work for self neglect and inability to care for herself. While in the ED, patient was not answering questions. CT head was negative. Labs revealed a mild AKI, BNP of 2279, and TSH of 11.72. On 1/14, patient O2 saturations were in the 70s and it was felt that she had an element of fluid overload. She was started on IV diuresis with improvement and has now diuresed almost 5L.   Assessment/Plan: 1. Acute respiratory failure with hypoxia. Suspected aspiration pneumonia clinically. Likely multifactorial including diastolic heart failure. Continues to require high flow nasal cannula. 2. Presumed aspiration pneumonia, possible sepsis. Treated empirically with IV Zosyn. Chest x-ray suggestive of left basilar infiltrate. Suspect she is developing sepsis. 3. Acute on chronic diastolic congestive heart failure. -5.5 L since admission. Chest x-ray without evidence of edema. No evidence of peripheral edema. 4. AKI, worsening, likely secondary to poor oral intake. 5. Acute encephalopathy superimposed on chronic dementia. CT head negative 2. Last Ativan greater than 24 hours, no narcotics. 6. Hypothyroidism. TSH 11.72 on admission. Treated appropriately without change in clinical status. 7. DM type 2 with hypoglycemia secondary to poor oral intake. This was treated appropriately with dextrose.  8. Failure to thrive, self-neglect. 9. Atrial fibrillation with rapid ventricular response. 10. Microcytic anemia, stable. Suspect chronic. 11. Dysphagia, ST recommends dysphagia 1 diet with honey thick liquids. 12. PMH alcoholism   Chart reviewed in detail. Patient has had an abrupt change with hypotension, hypoxia, and acute worsening of mental status. Suspect she's developed  sepsis from aspiration pneumonia. She's not responsive and appears to be dying. Discussed with social work. They have conferred with the Department of Social Services, the family members are empowered to make medical decisions, DSS has not pursued guardianship. Discussed in detail with son Ely Bonifas at bedside. Based on her poor clinical health and declined during this hospitalization, we discussed treatment options including more aggressive care, fluids, antibiotics and other oxygen treatment options. He reiterates the patient is DNR. Furthermore he requests full comfort care, no restarting of IV, no curative attempts. This appears to be reasonable based on the patient's history and clinical course. He understands that death could occur within the next 24 hours. This was witnessed by Jacques Navy, bedside nurse.  Code Status: DNR DVT prophylaxis: Lovenox Family Communication:  Disposition Plan: Possibly hospice Klarke Kenealy [phone], cell [phone]  Murray Hodgkins, MD  [facility]  Pager [phone] If 7PM-7AM, please contact night-coverage at [email or web address], password [facility] Jul 25, 2015, 6:51 AM  LOS: 5 days   Consultants:  ST- dysphagia 1 with honey thick liquids  Procedures:  ECHO Study Conclusions  - Left ventricle: The cavity size was below normal. Wall thickness was increased in a pattern of severe LVH. Systolic function was normal. The estimated ejection fraction was in the range of 60% to 65%. Wall motion was normal; there were no regional wall motion abnormalities. - Aortic valve: Moderately calcified annulus. Trileaflet; moderately thickened leaflets. There was mild regurgitation. Valve area (VTI): 2.42 cm^2. Valve area (Vmax): 2.45 cm^2. - Mitral valve: Moderately calcified annulus. Moderately thickened leaflets . There was mild regurgitation. - Left atrium: The atrium was massively dilated. - Right ventricle: The cavity size was moderately dilated.  The RV shares the apex with the LV. - Right atrium: The atrium  was massively dilated. - Atrial septum: No defect or patent foramen ovale was identified. - Pulmonary arteries: Systolic pressure was mildly increased. PA peak pressure: 35 mm Hg (S). - Pericardium, extracardiac: There is a small pericardial effusion adjacent to the inferior wall of the LV measuring 0.5 cm in width. - Technically adequate study  Antibiotics:  Zosyn 1/16>>  HPI/Subjective: Not responsive. Per RN hypoxic, hypotensive, not responding.   Objective: Filed Vitals:   07/02/15 1452 07/02/15 1733 07/02/15 2101 07/02/15 2217  BP: 109/51 139/70  103/46  Pulse:  84  47  Temp: 97.8 F (36.6 C)   97.6 F (36.4 C)  TempSrc: Axillary   Oral  Resp: 18   18  Height:      Weight:      SpO2: 100%  93% 93%    Intake/Output Summary (Last 24 hours) at 08/01/2015 0651 Last data filed at 07/02/15 1800  Gross per 24 hour  Intake      0 ml  Output    100 ml  Net   -100 ml     Filed Weights   06/30/15 0550 07/01/15 0653 07/02/15 0815  Weight: 60.555 kg (133 lb 8 oz) 60.32 kg (132 lb 15.7 oz) 55.248 kg (121 lb 12.8 oz)    Exam:     VSS, afebrile General: obtunded. Appears critically ill. Nonresponsive. Appears to be dying. Eyes: PERRL, normal lids ENT: grossly unremarkable. Neck: Appears grossly unremarkable Cardiovascular: RRR, no m/r/g. No LE edema. Weak femoral pulse palpated. Unable to palpate dorsalis pedis or radial pulses Respiratory: Tachypnea, no wheezes, rales or rhonchi. Moderate increased respiratory effort. Abdomen: soft, ntnd Skin: no rash or induration noted Musculoskeletal: grossly normal tone BUE/BLE; withdrawals all 4 extremities spontaneously and noxious stimuli. Psychiatric: Unable to assess, not responsive Neurologic: grossly non-focal.  New data reviewed:  -4.7 L since admission  Sodium 150, potassium 5.0, when necessary 31, creatinine 1.84, pro calcitonin 0.66  ABG  7.48/31/89  Chest x-ray and apparently reviewed, left base appears more opacified suggestive of infiltrate.  EKG afib RVR with lateral ST depression, more pronounced compared to 2015/07/27  Pertinent data since admission:  BNP 2279  Hgb 8.8  TSH 11.721    Scheduled Meds: . calcium carbonate  1 tablet Oral Daily  . enoxaparin (LOVENOX) injection  30 mg Subcutaneous Q24H  . insulin aspart  0-9 Units Subcutaneous TID WC  . levothyroxine  75 mcg Intravenous Daily  . metoprolol  2.5 mg Intravenous 4 times per day  . piperacillin-tazobactam (ZOSYN)  IV  3.375 g Intravenous Q8H  . sodium chloride  3 mL Intravenous Q12H   Continuous Infusions: . 0.9 % NaCl with KCl 40 mEq / L 75 mL/hr (07/02/15 1726)    Principal Problem:   Aspiration pneumonia ([place]) Active Problems:   Hypertension   Hypothyroidism   Type 2 diabetes mellitus (H[facility])   Atrial fibrillation    Carotid stenosis   Dementia with behavioral disturbance   Protein calorie malnutrition (H[facility])   Acute diastolic heart failure ([facility])   Chronic anticoagulation   Dysphagia, pharyngoesophageal phase   Microcytic anemia   Acute respiratory failure with hypoxia ([place])   Hypernatremia   Sepsis ([place])   Time spent 40 minutes   By signing my name below, I, Rosalie Doctor attest that this documentation has been prepared under the direction and in the presence of Murray Hodgkins, MD Electronically signed: Rosalie Doctor, Scribe.  08/01/2015  I personally performed the services described in this documentation. All medical  record entries made by the scribe were at my direction. I have reviewed the chart and agree that the record reflects my personal performance and is accurate and complete. Murray Hodgkins, MD

## 2015-07-17 NOTE — Progress Notes (Signed)
SLP Cancellation Note  Patient Details Name: Kathy Robbins MRN: [id number] DOB: 1930-11-27   Cancelled treatment:       Reason Eval/Treat Not Completed: Fatigue/lethargy limiting ability to participate;Patient's level of consciousness  Thank you,  Genene Churn, [place]  [place] 2015/07/20, 2:34 PM

## 2015-07-17 NOTE — Progress Notes (Signed)
Patient expired at 2115 with her son Chrystle Gallus at the bedside. All of patient's belongings were returned to [facility] including belongings that were in the room as well as those that had been locked in the safe. Dr. Hal Hope was notified as well as the [facility]. This nurse called [facility].

## 2015-07-17 DEATH — deceased

## 2015-08-15 ENCOUNTER — Encounter (HOSPITAL_COMMUNITY): Payer: Medicare PPO

## 2015-08-15 ENCOUNTER — Ambulatory Visit: Payer: Medicare PPO | Admitting: Family

## 2016-04-11 IMAGING — CR DG CHEST 1V PORT
1 series · 1 of 1 positions shown · non-contrast
Comparison: 06/25/2015

CLINICAL DATA: Altered mental status. Recent falls. Atrial
fibrillation. Personal history of colon on throat cancer.

EXAM:
PORTABLE CHEST 1 VIEW

[ap portable]
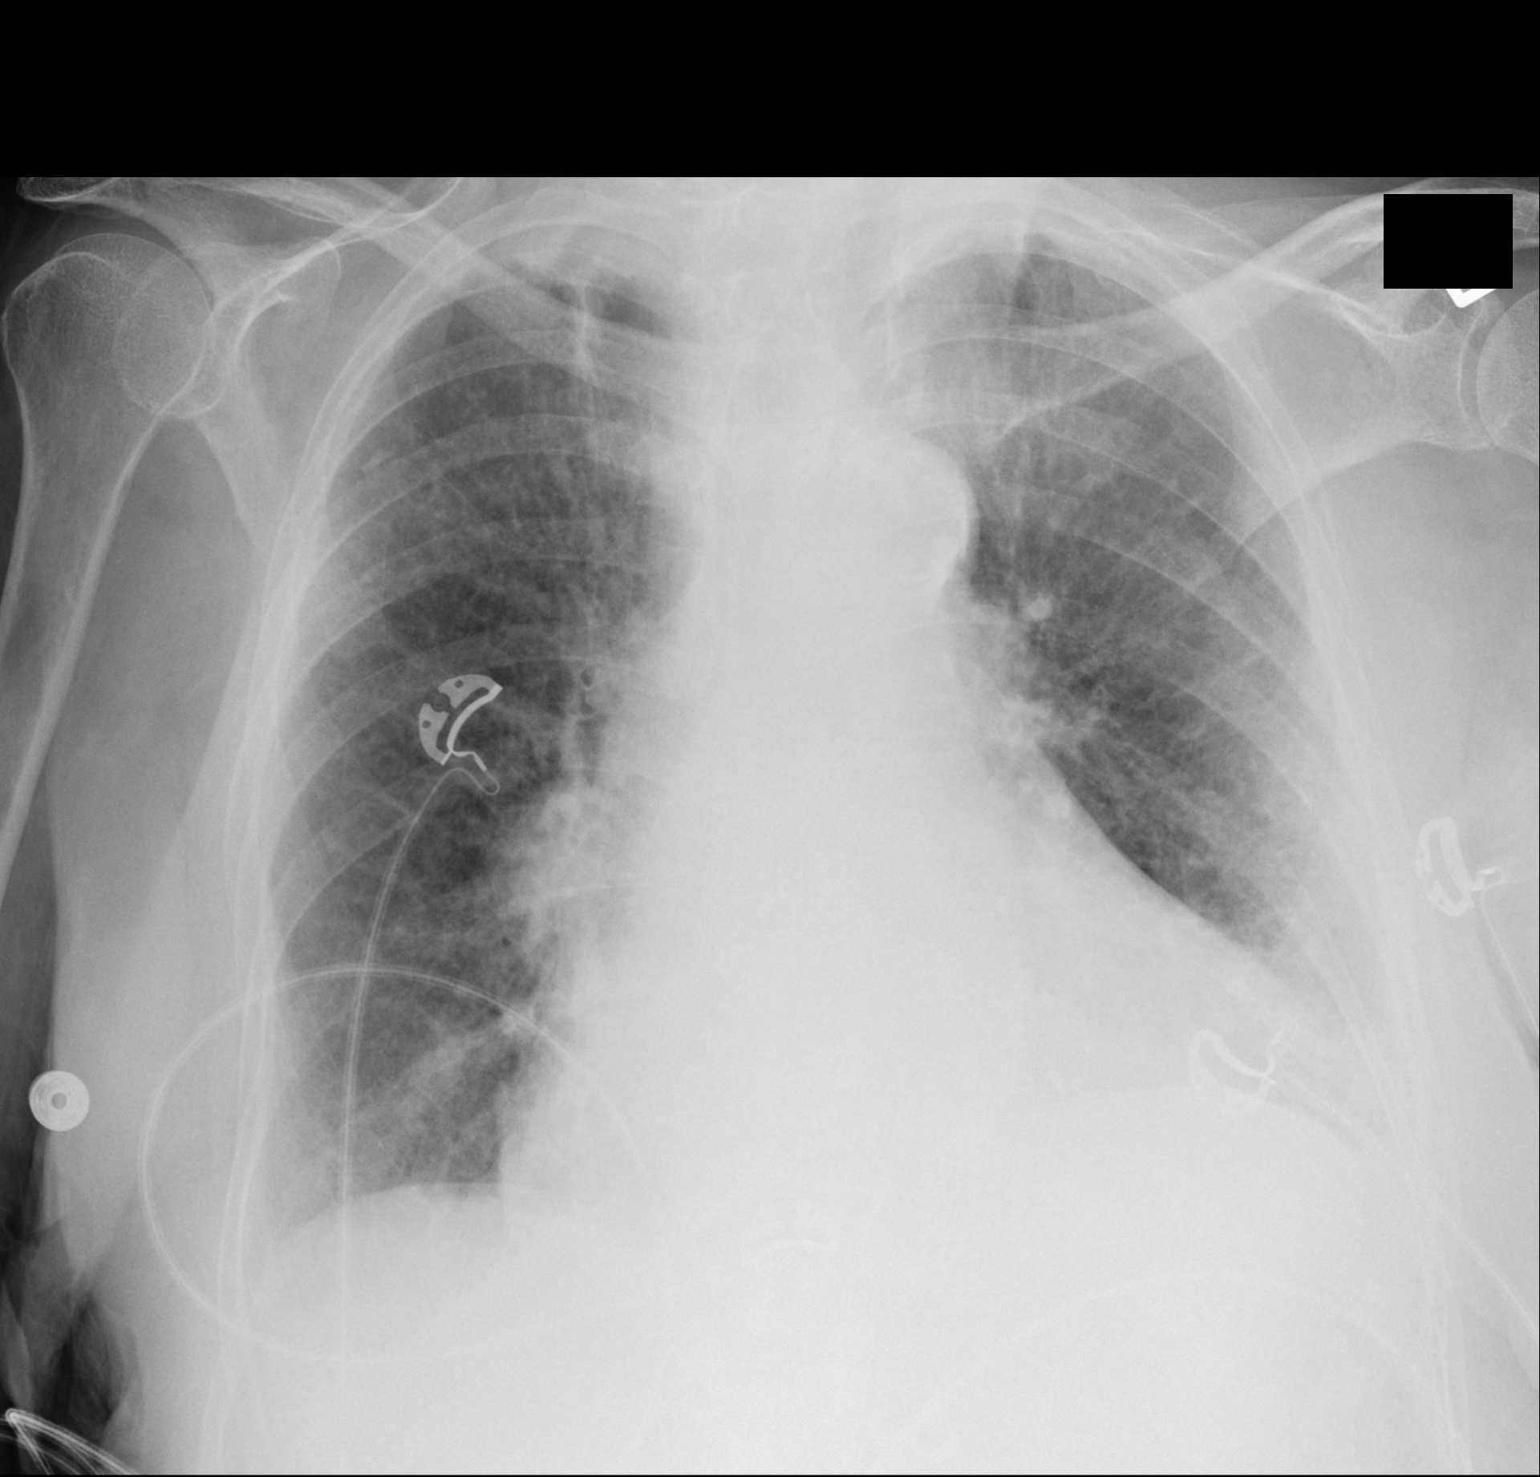

[1 of 1 positions shown; findings below may reference images not displayed]

FINDINGS: Moderate to severe cardiomegaly stable. Right apical scarring is
stable in appearance. Pulmonary hyperinflation is suspicious for
COPD. No evidence of pulmonary consolidation or pleural effusion.
IMPRESSION: Stable cardiomegaly, right apical scarring, and probable COPD. No
acute findings.

## 2016-04-14 IMAGING — CT CT HEAD W/O CM
1 of 2 series · 16 of 30 positions shown, 20 images · non-contrast
Comparison: 06/28/2015

CLINICAL DATA: Mental status change.

EXAM:
CT HEAD WITHOUT CONTRAST
TECHNIQUE: Contiguous axial images were obtained from the base of the skull
through the vertex without intravenous contrast.

[Series 2: headseq 4.8 h37s · axial · 0.45mm/px · z∈[+46,+206]mm · 16 of 36 slices shown, 20 images]
[im 2/36  brain]
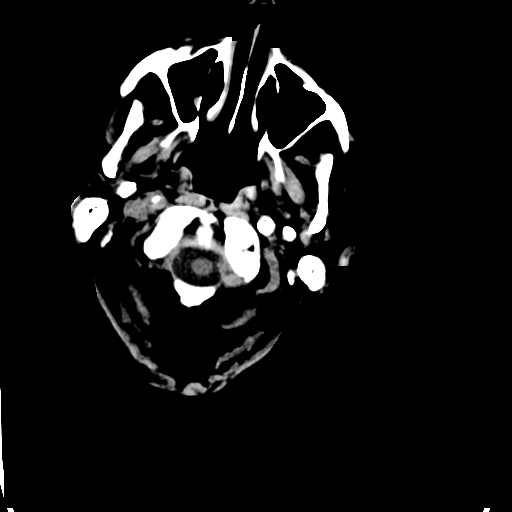
[im 2/36  bone]
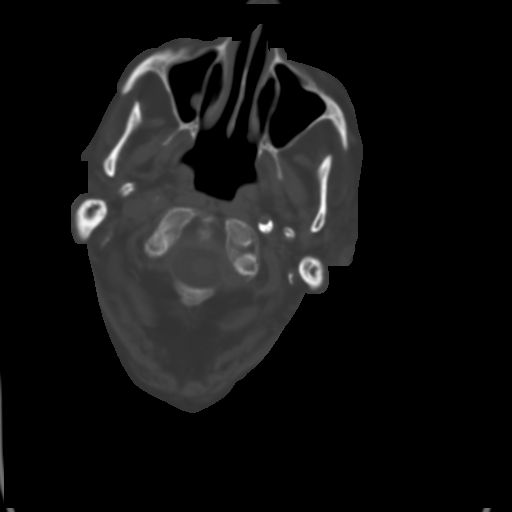
[im 4/36  brain]
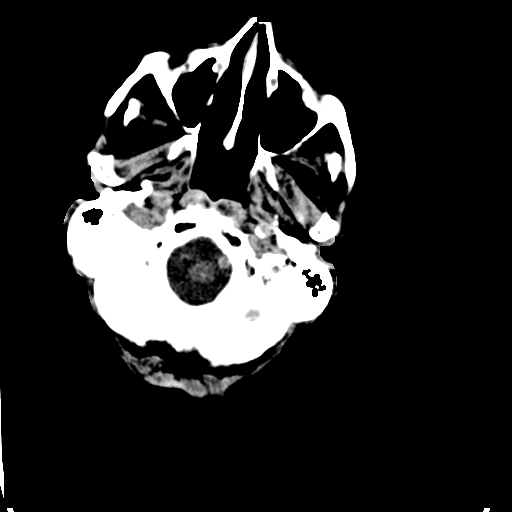
[im 6/36  brain]
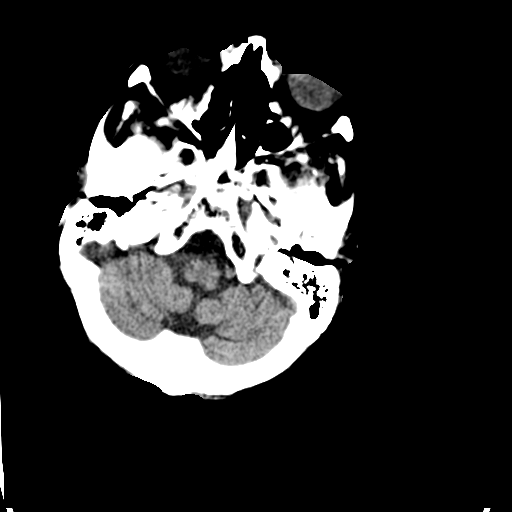
[im 8/36  brain]
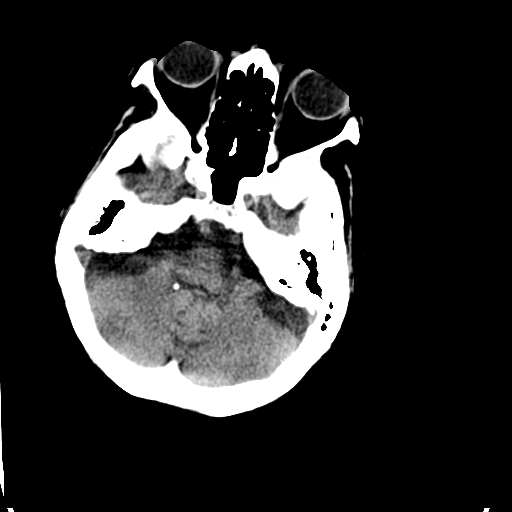
[im 12/36  brain]
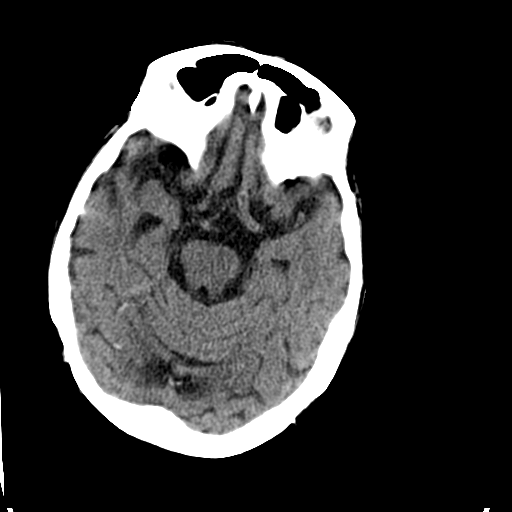
[im 12/36  bone]
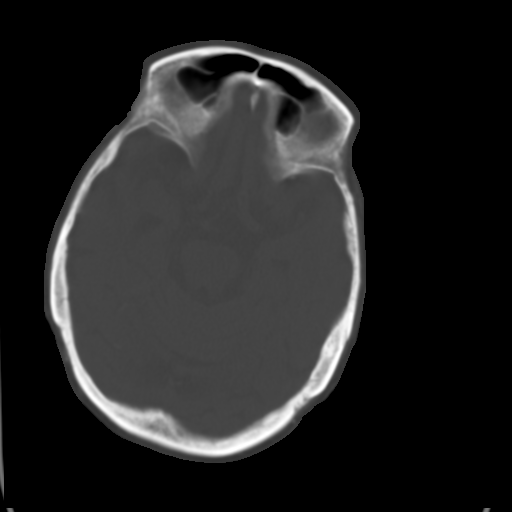
[im 13/36  brain]
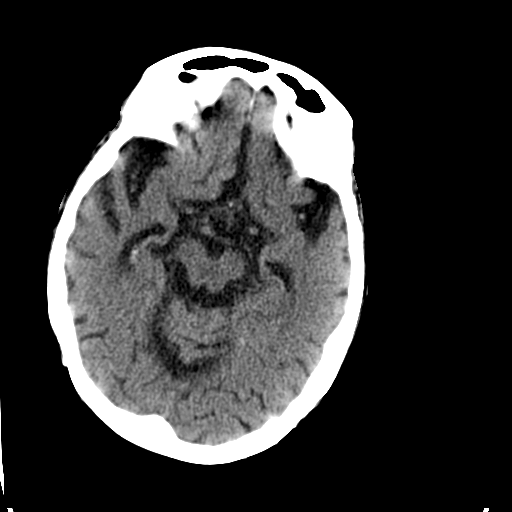
[im 15/36  brain]
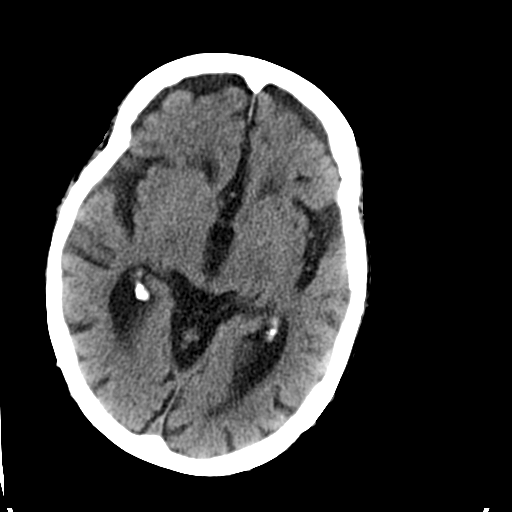
[im 17/36  brain]
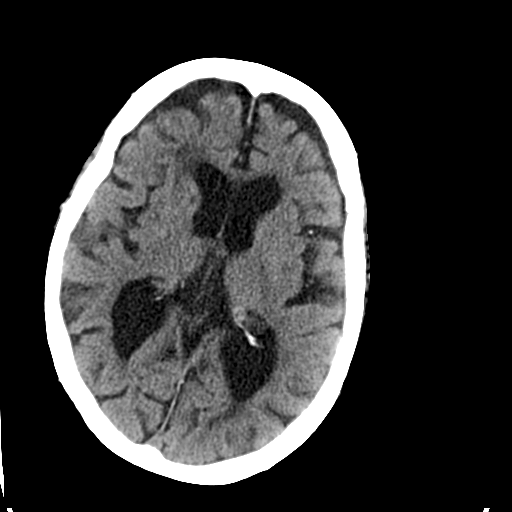
[im 19/36  brain]
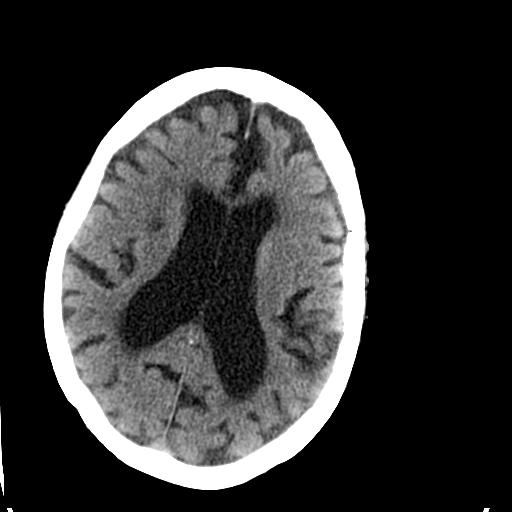
[im 19/36  bone]
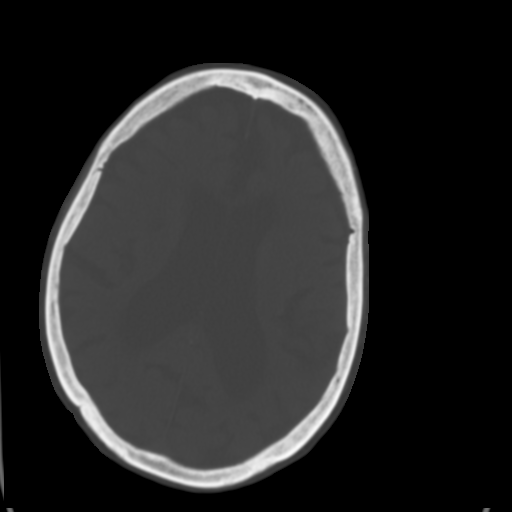
[im 21/36  brain]
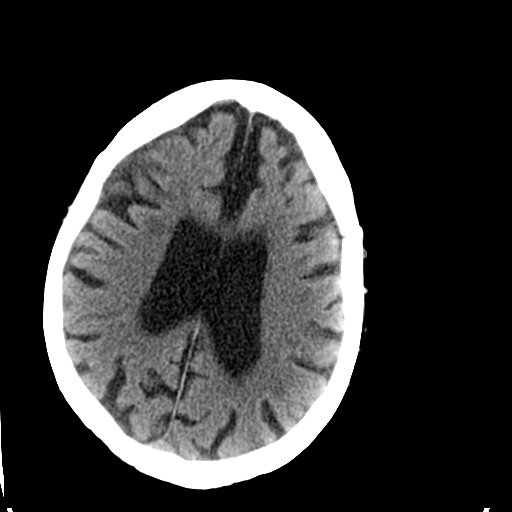
[im 23/36  brain]
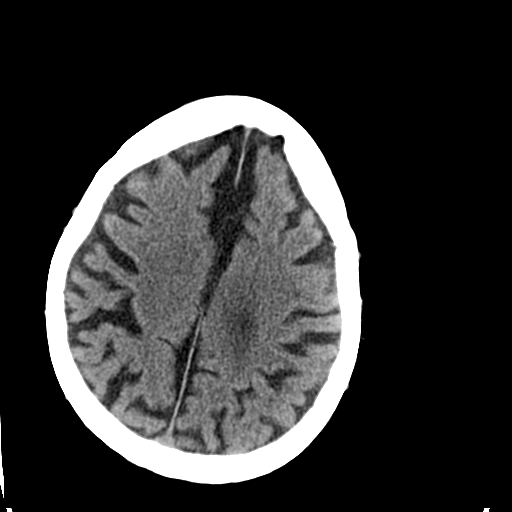
[im 24/36  brain]
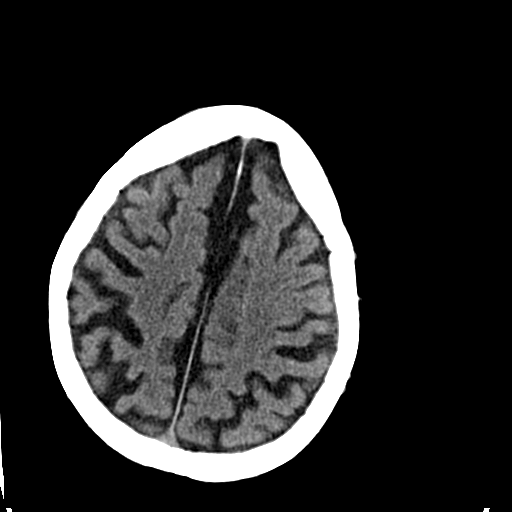
[im 28/36  brain]
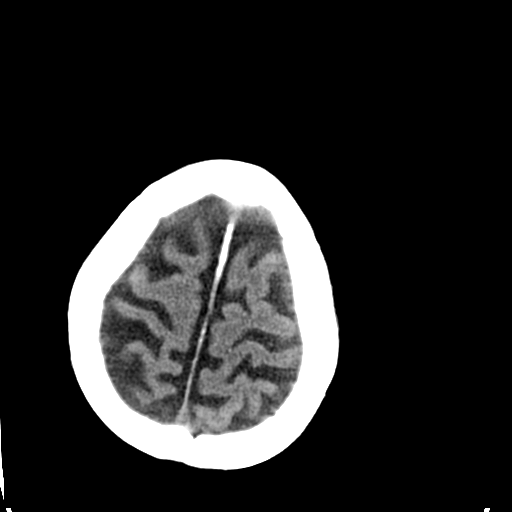
[im 28/36  bone]
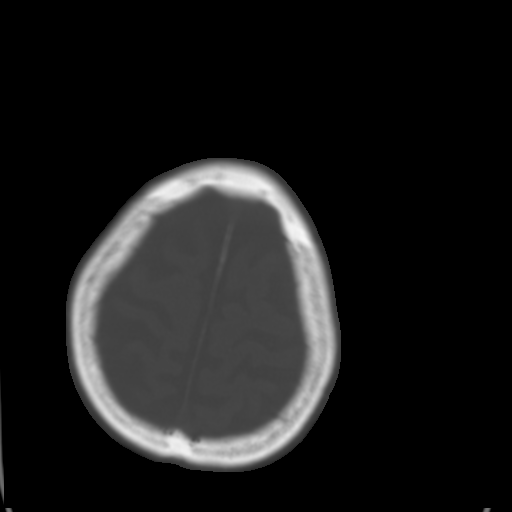
[im 30/36  brain]
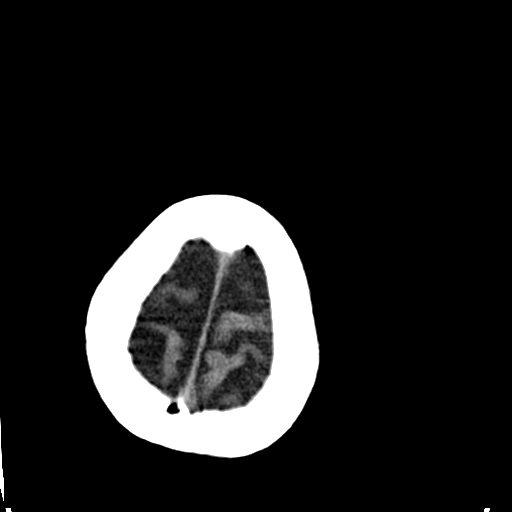
[im 32/36  brain]
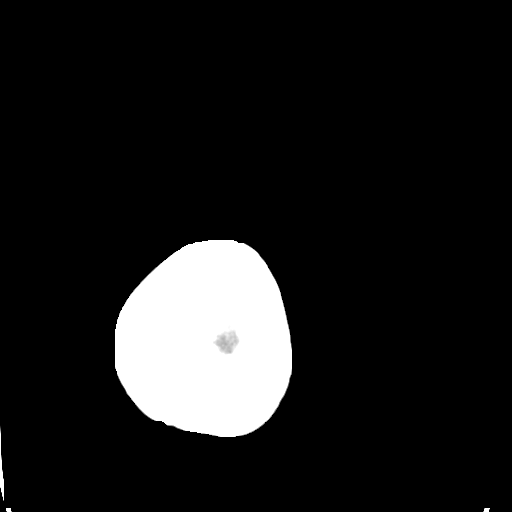
[im 34/36  brain]
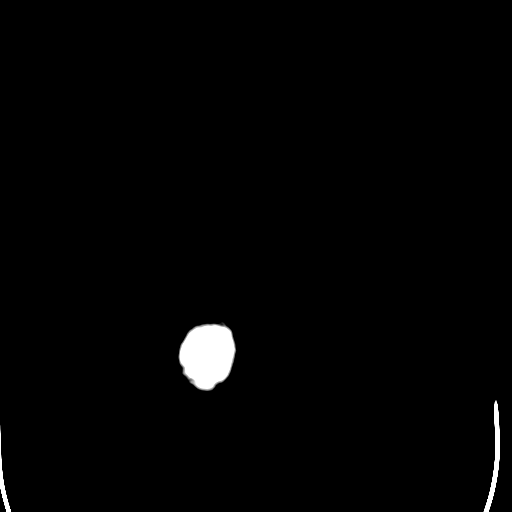

[16 of 30 positions shown; findings below may reference images not displayed]

FINDINGS: Brain: No evidence of acute infarction, hemorrhage, extra-axial
collection, ventriculomegaly, or mass effect. There is stable
moderate brain parenchymal atrophy and chronic small vessel disease
changes. Old right basal ganglia lacunar infarcts are also stable.

Vascular: No hyperdense vessel or unexpected calcification.

Skull: Negative for fracture or focal lesion.

Sinuses/Orbits: Stable polypoid mucosal thickening of the right
maxillary sinus, otherwise no acute findings.

Other: None.
IMPRESSION: No acute intracranial abnormality.

Moderate brain parenchymal atrophy and chronic findings of
microvascular disease.

## 2016-04-16 IMAGING — CR DG CHEST 1V PORT
1 series · 1 of 1 positions shown · non-contrast
Comparison: June 28, 2015.

CLINICAL DATA: Hypoxia.

EXAM:
PORTABLE CHEST 1 VIEW

[ap portable]
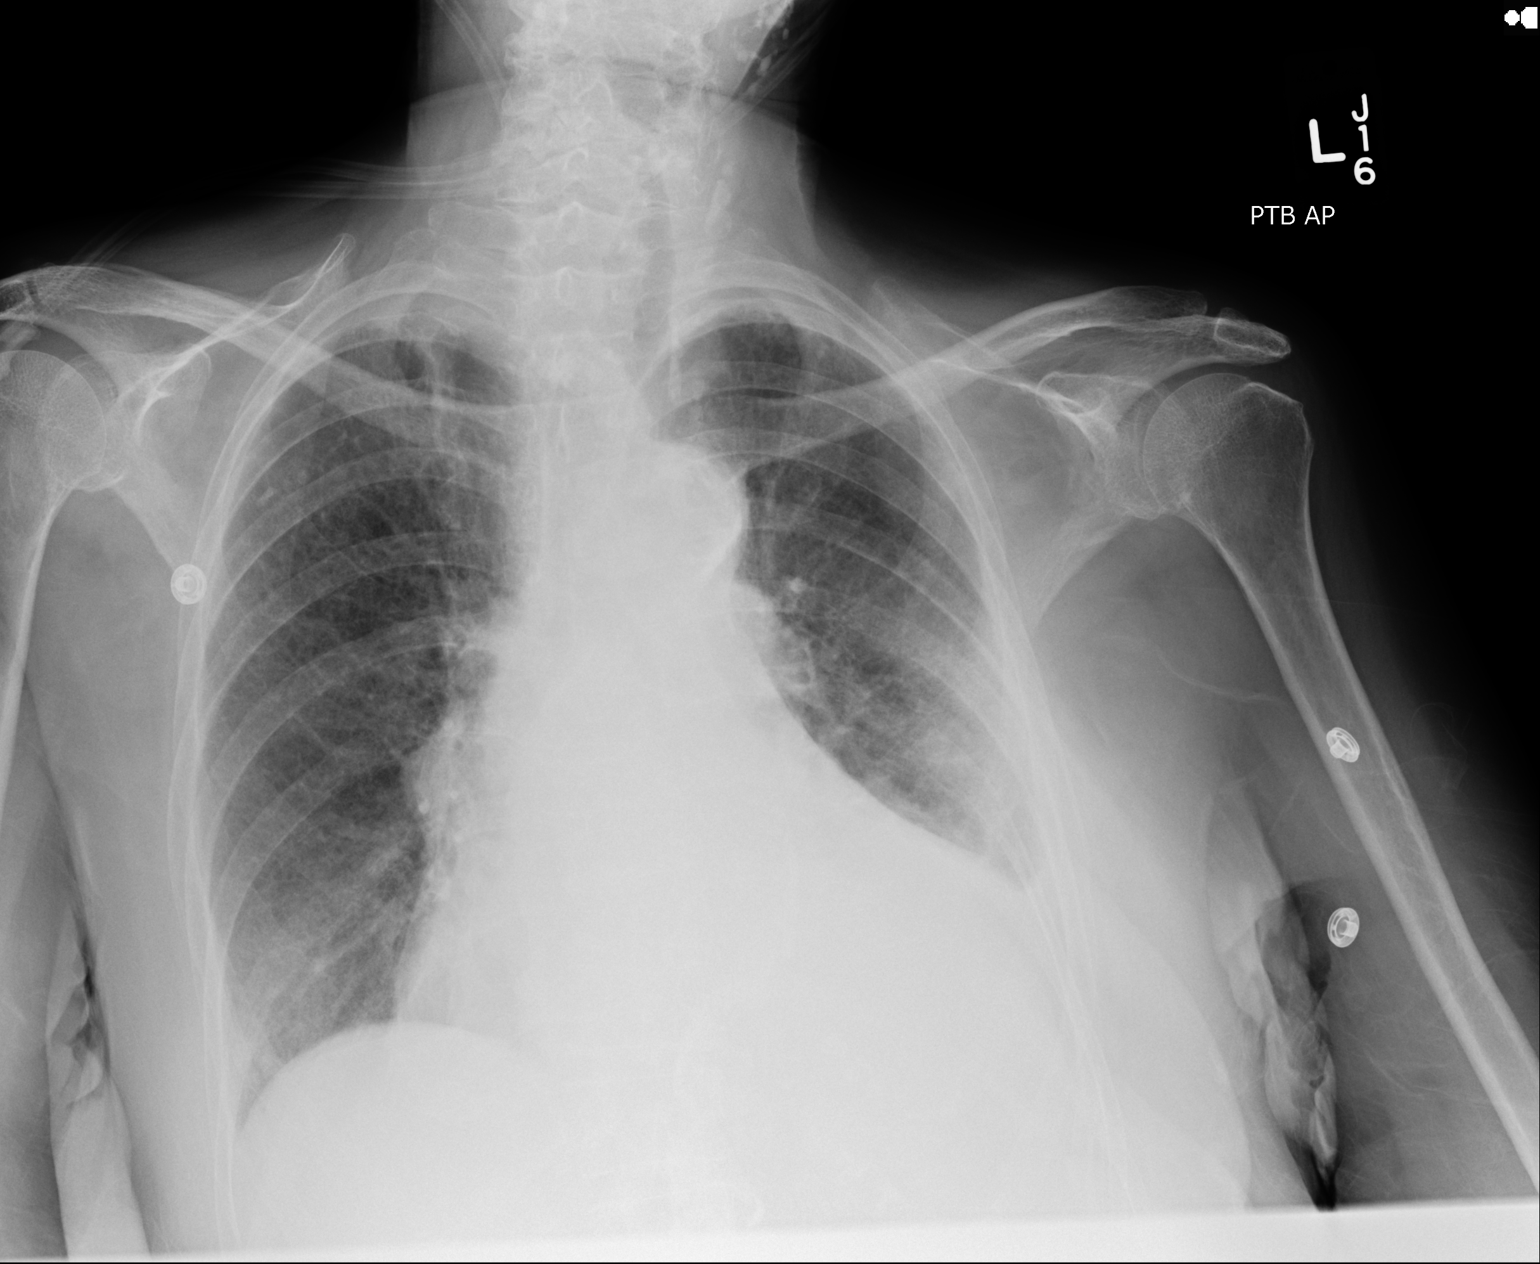

[1 of 1 positions shown; findings below may reference images not displayed]

FINDINGS: Stable cardiomegaly. No pneumothorax or significant pleural effusion
is noted. Stable diffuse interstitial densities are noted throughout
both lungs which may represent scarring, but superimposed edema or
inflammation cannot be excluded. Stable right apical scarring is
noted. Bony thorax is unremarkable.
IMPRESSION: Stable cardiomegaly. Stable diffuse interstitial densities are noted
most consistent with scarring, although superimposed edema or
inflammation cannot be excluded.
